# Patient Record
Sex: Male | Born: 1955 | Race: Black or African American | Hispanic: No | Marital: Married | Smoking: Former smoker
Health system: Southern US, Community
[De-identification: ages and names within clinical notes are randomized; demographics above are authoritative.]

## PROBLEM LIST (undated history)

## (undated) DIAGNOSIS — I2699 Other pulmonary embolism without acute cor pulmonale: Secondary | ICD-10-CM

## (undated) DIAGNOSIS — I82409 Acute embolism and thrombosis of unspecified deep veins of unspecified lower extremity: Secondary | ICD-10-CM

## (undated) DIAGNOSIS — C61 Malignant neoplasm of prostate: Secondary | ICD-10-CM

## (undated) DIAGNOSIS — M109 Gout, unspecified: Secondary | ICD-10-CM

## (undated) DIAGNOSIS — I10 Essential (primary) hypertension: Secondary | ICD-10-CM

## (undated) DIAGNOSIS — G4730 Sleep apnea, unspecified: Secondary | ICD-10-CM

## (undated) DIAGNOSIS — M1990 Unspecified osteoarthritis, unspecified site: Secondary | ICD-10-CM

## (undated) HISTORY — PX: WISDOM TOOTH EXTRACTION: SHX21

## (undated) HISTORY — PX: POLYPECTOMY: SHX149

## (undated) HISTORY — PX: PROSTATECTOMY: SHX69

## (undated) HISTORY — PX: APPENDECTOMY: SHX54

## (undated) HISTORY — PX: PROSTATE BIOPSY: SHX241

---

## 2006-06-28 ENCOUNTER — Emergency Department (HOSPITAL_COMMUNITY): Admission: AC | Admit: 2006-06-28 | Discharge: 2006-06-28 | Payer: Self-pay

## 2007-11-20 DIAGNOSIS — I2699 Other pulmonary embolism without acute cor pulmonale: Secondary | ICD-10-CM

## 2007-11-20 HISTORY — DX: Other pulmonary embolism without acute cor pulmonale: I26.99

## 2013-06-04 ENCOUNTER — Other Ambulatory Visit: Payer: Self-pay | Admitting: Urology

## 2013-06-04 DIAGNOSIS — C61 Malignant neoplasm of prostate: Secondary | ICD-10-CM

## 2013-08-11 ENCOUNTER — Ambulatory Visit (HOSPITAL_COMMUNITY)
Admission: RE | Admit: 2013-08-11 | Discharge: 2013-08-11 | Disposition: A | Discharge status: Home / Self Care | Payer: 59 | Source: Ambulatory Visit | Attending: Urology | Admitting: Urology

## 2013-08-11 DIAGNOSIS — C61 Malignant neoplasm of prostate: Secondary | ICD-10-CM | POA: Insufficient documentation

## 2013-08-11 LAB — CREATININE, SERUM
Creatinine, Ser: 1 mg/dL (ref 0.50–1.35)
GFR calc non Af Amer: 80 mL/min — ABNORMAL LOW (ref >90)

## 2013-08-11 MED ORDER — GADOBENATE DIMEGLUMINE 529 MG/ML IV SOLN: 20.0000 mL | Freq: Once | INTRAVENOUS | Status: AC | PRN

## 2013-09-07 ENCOUNTER — Other Ambulatory Visit: Payer: Self-pay | Admitting: Urology

## 2013-09-23 ENCOUNTER — Encounter (HOSPITAL_COMMUNITY): Payer: Self-pay | Admitting: Pharmacy Technician

## 2013-09-23 ENCOUNTER — Encounter (HOSPITAL_COMMUNITY): Payer: Self-pay

## 2013-09-23 ENCOUNTER — Ambulatory Visit (HOSPITAL_COMMUNITY)
Admission: RE | Admit: 2013-09-23 | Discharge: 2013-09-23 | Disposition: A | Discharge status: Home / Self Care | Payer: 59 | Source: Ambulatory Visit | Attending: Urology | Admitting: Urology

## 2013-09-23 ENCOUNTER — Encounter (HOSPITAL_COMMUNITY)
Admission: RE | Admit: 2013-09-23 | Discharge: 2013-09-23 | Disposition: A | Discharge status: Home / Self Care | Payer: 59 | Source: Ambulatory Visit | Attending: Urology | Admitting: Urology

## 2013-09-23 DIAGNOSIS — Z01810 Encounter for preprocedural cardiovascular examination: Secondary | ICD-10-CM | POA: Insufficient documentation

## 2013-09-23 DIAGNOSIS — Z01818 Encounter for other preprocedural examination: Secondary | ICD-10-CM | POA: Insufficient documentation

## 2013-09-23 DIAGNOSIS — Z01812 Encounter for preprocedural laboratory examination: Secondary | ICD-10-CM | POA: Insufficient documentation

## 2013-09-23 HISTORY — DX: Other pulmonary embolism without acute cor pulmonale: I26.99

## 2013-09-23 HISTORY — DX: Essential (primary) hypertension: I10

## 2013-09-23 HISTORY — DX: Gout, unspecified: M10.9

## 2013-09-23 HISTORY — DX: Sleep apnea, unspecified: G47.30

## 2013-09-23 LAB — BASIC METABOLIC PANEL
BUN: 15 mg/dL (ref 6–23)
CO2: 24 mEq/L (ref 19–32)
Creatinine, Ser: 0.94 mg/dL (ref 0.50–1.35)
GFR calc Af Amer: >90 mL/min (ref >90)
Glucose, Bld: 85 mg/dL (ref 70–99)
Sodium: 140 mEq/L (ref 135–145)

## 2013-09-23 LAB — CBC
MCH: 30.7 pg (ref 26.0–34.0)
MCHC: 34 g/dL (ref 30.0–36.0)
Platelets: 161 10*3/uL (ref 150–400)
RDW: 12.9 % (ref 11.5–15.5)

## 2013-09-23 NOTE — Patient Instructions (Addendum)
Dwayne Lawrence  09/23/2013   Your procedure is scheduled on: Wednesday Oct 07, 2013  Report to [facility] at 630  AM.  Call this number if you have problems the morning of surgery [phone]   Remember: follow any bowel prep orders from dr grapey   Do not eat food or drink liquids :After Midnight.     Take these medicines the morning of surgery with A SIP OF WATER: amlodipine                                SEE [facility] PREPARING FOR SURGERY SHEET             You may not have any metal on your body including hair pins and piercings  Do not wear jewelry, make-up.  Do not wear lotions, powders, or perfumes. You may wear deodorant.   Men may shave face and neck.  Do not bring valuables to the hospital. [facility] IS NOT RESPONSIBLE FOR VALUEABLES.  Contacts, dentures or bridgework may not be worn into surgery.  Leave suitcase in the car. After surgery it may be brought to your room.  For patients admitted to the hospital, checkout time is 11:00 AM the day of discharge.     Please read over the following fact sheets that you were given: blood fact sheet  Call Cain Sieve RN pre op nurse if needed [phone]    FAILURE TO FOLLOW THESE INSTRUCTIONS MAY RESULT IN THE CANCELLATION OF YOUR SURGERY.  PATIENT SIGNATURE___________________________________________  NURSE SIGNATURE_____________________________________________

## 2013-10-06 NOTE — H&P (Signed)
Reason For Visit   [name] presents today for a more extended cancer discussion. The prostate cancer history from Dr. Retta Diones is summarized below. The patient has undergone several consultations with regard to treatment options and was interested in robotic prostatectomy. He is here with his wife today to further discuss that option with me. There has been no other change clinically. He has had no intraabdominal surgery. He has essentially no voiding complaints with an AUA score of approximately 1-2. His preoperative sexual functioning score is 23/25.       History of Present Illness    Dwayne Lawrence comes in today with his wife for prostate cancer conference. His surveillance biopsy revealed Gleason 4+3 disease in 3 cores on the left side. Initially, an 2013 his initial biopsy in [place] revealed only one core showing Gleason 3+3 disease. Prostatic volume was 39 cc. He does not have significant lower urinary tract symptoms.      Adenocarcinoma of the prostate. This was originally diagnosed in 2013. His initial biopsies revealed a clinical stage TIcNxMx, Gleason 3+3 adenocarcinoma. 1/10 cores were positive for adenocarcinoma at the left base. 10% of that core was involved. Based on a PSA of 3.99 and his low volume, low grade disease, he is considered a low risk category. Despite this, he had a staging CT scan of his abdomen and pelvis which revealed no abnormal urologic findings. Left portal vein thrombosis was identified which resolved on followup CT scan.     Some recent MRI revealed some nodularity at the right base, opposite the side from his positive biopsy. He underwent surveillance biopsy 08/21/2013. This revealed 3 cores on the left now showing Gleason 4+3 disease. PSA earlier this year was approximately 9. MRI performed prior to his biopsy revealed that nodularity on the left, but no evidence of extracapsular extension, seminal vesicle involvement, bony disease or lymph node  metastases.    Based on the Kattan nomogram, he has an approximate 65% chance of having organ confined disease, 15% chance of seminal vesicle involvement and approximately 3% chance of having nodal disease.   Past Medical History Problems  1. History of Gout (274.9) 2. History of sleep apnea (V13.89) 3. Personal history of prostate cancer (V10.46) 4. History of Pulmonary Embolism (V12.55)  Surgical History Problems  1. History of No Surgical Problems  Current Meds 1. AmLODIPine Besylate 5 MG Oral Tablet;  Therapy: (Recorded:18Sep2013) to Recorded  Allergies Medication  1. No Known Drug Allergies  Family History Problems  1. Family history of Colon Cancer (V16.0) : Mother 2. Family history of Colon Cancer (V16.0) : Father 3. Family history of Death In The Family Father   Deceased at age 39; colon cancer 4. Family history of Family Health Status Number Of Children   2 sons & 1 daughter  Social History Problems  1. Alcohol Use   3 2. Caffeine Use   1 per week 3. 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 Marital History - Currently Married 5. Occupation: Retired  Research scientist (life sciences) Vital Signs [Data Includes: Last 1 Day]  Recorded: 27Oct2014 04:25PM  Blood Pressure: 112 / 75 Temperature: 98.2 F Heart Rate: 81  WD WN Male in NAD Resp: nl Cardiac: nl Abd: soft/ NT/ no HSM Ext: no edma/ tenderess Assessment Assessed  1. Prostate cancer (185)             End of Encounter Meds  Medication Name Instruction AmLODIPine Besylate 5 MG Oral Tablet   Plan  Prostate cancer  1. Follow-up Keep Future Appt Office  Follow-up  Status: Hold For - Appointment  Requested  for: 27Oct2014 2. PT/OT Referral Referral  Referral  Status: Hold For - Appointment,PreCert,Date of  Service,Physical Therapy  Requested for: 27Oct2014  Discussion/Summary  A total of 50 minutes were spent in the overall care of the patient today with 50 minutes in direct face to  face consultation.    The patient was counseled about the natural history of prostate cancer and the standard treatment options that are available for prostate cancer. It was explained to him how his age and life expectancy, clinical stage, Gleason score, and PSA affect his prognosis, the decision to proceed with additional staging studies, as well as how that information influences recommended treatment strategies. We discussed the roles for active surveillance, radiation therapy, surgical therapy, androgen deprivation, as well as ablative therapy options for the treatment of prostate cancer as appropriate to his individual cancer situation. We discussed the risks and benefits of these options with regard to their impact on cancer control and also in terms of potential adverse events, complications, and impact on quiality of life particularly related to urinary, bowel, and sexual function. The patient was encouraged to ask questions throughout the discussion today and all questions were answered to his stated satisfaction. In addition, the patient was provided with and/or directed to appropriate resources and literature for further education about prostate cancer and treatment options.   We discussed surgical therapy for prostate cancer including the different available surgical approaches. We discussed, in detail, the risks and expectations of surgery with regard to cancer control, urinary control, and erectile function as well as the expected postoperative recovery process. The risks, potential complications/adverse events of radical prostatectomy as well as alternative options were explained to the patient.   We discussed surgical therapy for prostate cancer including the different available surgical approaches. We discussed, in detail, the risks and expectations of surgery with regard to cancer control, urinary control, and erectile function as well as the expected postoperative recovery process.  Additional risks of surgery including but not limited to bleeding, infection, hernia formation, nerve damage, lymphocele formation, bowel/rectal injury potentially necessitating colostomy, damage to the urinary tract resulting in urine leakage, urethral stricture, and the cardiopulmonary risks such as myocardial infarction, stroke, death, venothromboembolism, etc. were explained. The risk of open surgical conversion for robotic/laparoscopic prostatectomy was also discussed.    AmendmentB PLND with limited left nerve spare   Future Appointments  Date/Time Provider Specialty Site 10/07/2013 08:30 AM Barron Alvine, M.D. Urology [facility] 10/07/2013 08:30 AM Pecola Leisure, PA-C Urology [facility] 10/14/2013 09:15 AM Denna Haggard, NP-C Urology [facility] Electronically signed by : Barron Alvine, M.D.; Sep 15 2013  8:26AM EST

## 2013-10-07 ENCOUNTER — Ambulatory Visit (HOSPITAL_COMMUNITY)
Admission: RE | Admit: 2013-10-07 | Discharge: 2013-10-08 | Disposition: A | Discharge status: Home / Self Care | Payer: 59 | Source: Ambulatory Visit | Attending: Urology | Admitting: Urology

## 2013-10-07 ENCOUNTER — Encounter (HOSPITAL_COMMUNITY): Payer: 59 | Admitting: Certified Registered Nurse Anesthetist

## 2013-10-07 ENCOUNTER — Ambulatory Visit (HOSPITAL_COMMUNITY): Payer: 59 | Admitting: Certified Registered Nurse Anesthetist

## 2013-10-07 ENCOUNTER — Encounter (HOSPITAL_COMMUNITY): Payer: Self-pay | Admitting: *Deleted

## 2013-10-07 ENCOUNTER — Encounter (HOSPITAL_COMMUNITY)
Admission: RE | Disposition: A | Discharge status: Home / Self Care | Payer: Self-pay | Source: Ambulatory Visit | Attending: Urology

## 2013-10-07 DIAGNOSIS — G4730 Sleep apnea, unspecified: Secondary | ICD-10-CM | POA: Insufficient documentation

## 2013-10-07 DIAGNOSIS — M109 Gout, unspecified: Secondary | ICD-10-CM | POA: Insufficient documentation

## 2013-10-07 DIAGNOSIS — Z86711 Personal history of pulmonary embolism: Secondary | ICD-10-CM | POA: Insufficient documentation

## 2013-10-07 DIAGNOSIS — C61 Malignant neoplasm of prostate: Principal | ICD-10-CM | POA: Insufficient documentation

## 2013-10-07 HISTORY — PX: ROBOT ASSISTED LAPAROSCOPIC RADICAL PROSTATECTOMY: SHX5141

## 2013-10-07 HISTORY — PX: LYMPHADENECTOMY: SHX5960

## 2013-10-07 LAB — HEMOGLOBIN AND HEMATOCRIT, BLOOD
HCT: 44.1 % (ref 39.0–52.0)
Hemoglobin: 14.7 g/dL (ref 13.0–17.0)

## 2013-10-07 LAB — TYPE AND SCREEN
ABO/RH(D): B POS
Antibody Screen: NEGATIVE

## 2013-10-07 SURGERY — ROBOTIC ASSISTED LAPAROSCOPIC RADICAL PROSTATECTOMY
Anesthesia: General | Site: Abdomen | Wound class: Clean Contaminated

## 2013-10-07 MED ORDER — ONDANSETRON HCL 4 MG/2ML IJ SOLN
INTRAMUSCULAR | Status: DC | PRN
  Administered 2013-10-07: 4 mg via INTRAVENOUS

## 2013-10-07 MED ORDER — LACTATED RINGERS IV SOLN: INTRAVENOUS | Status: DC

## 2013-10-07 MED ORDER — DEXAMETHASONE SODIUM PHOSPHATE 10 MG/ML IJ SOLN
INTRAMUSCULAR | Status: DC | PRN
  Administered 2013-10-07: 10 mg via INTRAVENOUS

## 2013-10-07 MED ORDER — ROCURONIUM BROMIDE 100 MG/10ML IV SOLN
INTRAVENOUS | Status: AC
  Filled 2013-10-07: qty 1

## 2013-10-07 MED ORDER — DEXTROSE-NACL 5-0.45 % IV SOLN
INTRAVENOUS | Status: DC
  Administered 2013-10-07 – 2013-10-08 (×3): via INTRAVENOUS

## 2013-10-07 MED ORDER — LIDOCAINE HCL (CARDIAC) 20 MG/ML IV SOLN
INTRAVENOUS | Status: AC
  Filled 2013-10-07: qty 5

## 2013-10-07 MED ORDER — SUCCINYLCHOLINE CHLORIDE 20 MG/ML IJ SOLN
INTRAMUSCULAR | Status: DC | PRN
  Administered 2013-10-07: 140 mg via INTRAVENOUS

## 2013-10-07 MED ORDER — FENTANYL CITRATE 0.05 MG/ML IJ SOLN
INTRAMUSCULAR | Status: AC
  Filled 2013-10-07: qty 5

## 2013-10-07 MED ORDER — BUPIVACAINE-EPINEPHRINE 0.25% -1:200000 IJ SOLN
INTRAMUSCULAR | Status: DC | PRN
  Administered 2013-10-07: 30 mL

## 2013-10-07 MED ORDER — HYDROCODONE-ACETAMINOPHEN 5-325 MG PO TABS
1.0000 | ORAL_TABLET | ORAL | Status: DC | PRN
  Administered 2013-10-08: 2 via ORAL
  Filled 2013-10-07 (×2): qty 1

## 2013-10-07 MED ORDER — PROPOFOL 10 MG/ML IV BOLUS
INTRAVENOUS | Status: AC
  Filled 2013-10-07: qty 20

## 2013-10-07 MED ORDER — MIDAZOLAM HCL 5 MG/5ML IJ SOLN
INTRAMUSCULAR | Status: DC | PRN
  Administered 2013-10-07: 2 mg via INTRAVENOUS

## 2013-10-07 MED ORDER — GLYCOPYRROLATE 0.2 MG/ML IJ SOLN
INTRAMUSCULAR | Status: AC
  Filled 2013-10-07: qty 4

## 2013-10-07 MED ORDER — PHENOL 1.4 % MT LIQD
1.0000 | OROMUCOSAL | Status: DC | PRN
  Administered 2013-10-07: 1 via OROMUCOSAL
  Filled 2013-10-07 (×2): qty 177

## 2013-10-07 MED ORDER — ACETAMINOPHEN 325 MG PO TABS: 650.0000 mg | ORAL_TABLET | ORAL | Status: DC | PRN

## 2013-10-07 MED ORDER — CIPROFLOXACIN HCL 500 MG PO TABS: 500.0000 mg | ORAL_TABLET | Freq: Two times a day (BID) | ORAL | Status: DC

## 2013-10-07 MED ORDER — SODIUM CHLORIDE 0.9 % IR SOLN
Status: DC | PRN
  Administered 2013-10-07: 1000 mL via INTRAVESICAL

## 2013-10-07 MED ORDER — METHYLENE BLUE 1 % INJ SOLN
INTRAMUSCULAR | Status: AC
  Filled 2013-10-07: qty 10

## 2013-10-07 MED ORDER — HEPARIN SODIUM (PORCINE) 1000 UNIT/ML IJ SOLN
INTRAMUSCULAR | Status: AC
  Filled 2013-10-07: qty 1

## 2013-10-07 MED ORDER — HYDROMORPHONE HCL PF 1 MG/ML IJ SOLN
INTRAMUSCULAR | Status: AC
  Filled 2013-10-07: qty 1

## 2013-10-07 MED ORDER — ROCURONIUM BROMIDE 100 MG/10ML IV SOLN
INTRAVENOUS | Status: DC | PRN
  Administered 2013-10-07: 50 mg via INTRAVENOUS
  Administered 2013-10-07: 10 mg via INTRAVENOUS
  Administered 2013-10-07 (×3): 5 mg via INTRAVENOUS
  Administered 2013-10-07: 10 mg via INTRAVENOUS

## 2013-10-07 MED ORDER — MIDAZOLAM HCL 2 MG/2ML IJ SOLN
INTRAMUSCULAR | Status: AC
  Filled 2013-10-07: qty 2

## 2013-10-07 MED ORDER — LIDOCAINE HCL (CARDIAC) 20 MG/ML IV SOLN
INTRAVENOUS | Status: DC | PRN
  Administered 2013-10-07: 100 mg via INTRAVENOUS

## 2013-10-07 MED ORDER — GLYCOPYRROLATE 0.2 MG/ML IJ SOLN
INTRAMUSCULAR | Status: DC | PRN
  Administered 2013-10-07: 0.2 mg via INTRAVENOUS
  Administered 2013-10-07: .8 mg via INTRAVENOUS

## 2013-10-07 MED ORDER — FENTANYL CITRATE 0.05 MG/ML IJ SOLN
INTRAMUSCULAR | Status: DC | PRN
  Administered 2013-10-07 (×5): 50 ug via INTRAVENOUS
  Administered 2013-10-07: 100 ug via INTRAVENOUS
  Administered 2013-10-07 (×3): 50 ug via INTRAVENOUS

## 2013-10-07 MED ORDER — STERILE WATER FOR IRRIGATION IR SOLN
Status: DC | PRN
  Administered 2013-10-07: 3000 mL via INTRAVESICAL

## 2013-10-07 MED ORDER — AMLODIPINE BESYLATE 5 MG PO TABS
5.0000 mg | ORAL_TABLET | Freq: Every morning | ORAL | Status: DC
  Administered 2013-10-08: 5 mg via ORAL
  Filled 2013-10-07: qty 1

## 2013-10-07 MED ORDER — MORPHINE SULFATE 2 MG/ML IJ SOLN: 2.0000 mg | INTRAMUSCULAR | Status: DC | PRN

## 2013-10-07 MED ORDER — MENTHOL 3 MG MT LOZG
1.0000 | LOZENGE | OROMUCOSAL | Status: DC | PRN
  Administered 2013-10-07: 21:00:00 3 mg via ORAL
  Filled 2013-10-07 (×2): qty 9

## 2013-10-07 MED ORDER — SUCCINYLCHOLINE CHLORIDE 20 MG/ML IJ SOLN
INTRAMUSCULAR | Status: AC
  Filled 2013-10-07: qty 2

## 2013-10-07 MED ORDER — ONDANSETRON HCL 4 MG/2ML IJ SOLN
INTRAMUSCULAR | Status: AC
  Filled 2013-10-07: qty 2

## 2013-10-07 MED ORDER — CEFAZOLIN SODIUM-DEXTROSE 2-3 GM-% IV SOLR
INTRAVENOUS | Status: AC
  Filled 2013-10-07: qty 50

## 2013-10-07 MED ORDER — GLYCOPYRROLATE 0.2 MG/ML IJ SOLN
INTRAMUSCULAR | Status: AC
  Filled 2013-10-07: qty 1

## 2013-10-07 MED ORDER — PROPOFOL 10 MG/ML IV BOLUS
INTRAVENOUS | Status: DC | PRN
  Administered 2013-10-07: 200 mg via INTRAVENOUS

## 2013-10-07 MED ORDER — PROMETHAZINE HCL 25 MG/ML IJ SOLN: 6.2500 mg | INTRAMUSCULAR | Status: DC | PRN

## 2013-10-07 MED ORDER — CEFAZOLIN SODIUM-DEXTROSE 2-3 GM-% IV SOLR
2.0000 g | INTRAVENOUS | Status: AC
  Administered 2013-10-07: 2 g via INTRAVENOUS

## 2013-10-07 MED ORDER — LACTATED RINGERS IV SOLN
INTRAVENOUS | Status: DC | PRN
  Administered 2013-10-07 (×2): via INTRAVENOUS

## 2013-10-07 MED ORDER — SODIUM CHLORIDE 0.9 % IV BOLUS (SEPSIS)
1000.0000 mL | Freq: Once | INTRAVENOUS | Status: AC
  Administered 2013-10-07: 1000 mL via INTRAVENOUS

## 2013-10-07 MED ORDER — KETOROLAC TROMETHAMINE 30 MG/ML IJ SOLN
30.0000 mg | Freq: Four times a day (QID) | INTRAMUSCULAR | Status: AC
  Administered 2013-10-07 – 2013-10-08 (×6): 30 mg via INTRAVENOUS
  Filled 2013-10-07 (×5): qty 1

## 2013-10-07 MED ORDER — NEOSTIGMINE METHYLSULFATE 1 MG/ML IJ SOLN
INTRAMUSCULAR | Status: AC
  Filled 2013-10-07: qty 10

## 2013-10-07 MED ORDER — HYDROCODONE-ACETAMINOPHEN 5-325 MG PO TABS: 1.0000 | ORAL_TABLET | Freq: Four times a day (QID) | ORAL | Status: DC | PRN

## 2013-10-07 MED ORDER — NEOSTIGMINE METHYLSULFATE 1 MG/ML IJ SOLN
INTRAMUSCULAR | Status: DC | PRN
  Administered 2013-10-07: 5 mg via INTRAVENOUS

## 2013-10-07 MED ORDER — DEXAMETHASONE SODIUM PHOSPHATE 10 MG/ML IJ SOLN
INTRAMUSCULAR | Status: AC
  Filled 2013-10-07: qty 1

## 2013-10-07 MED ORDER — KETOROLAC TROMETHAMINE 30 MG/ML IJ SOLN
INTRAMUSCULAR | Status: AC
  Administered 2013-10-07: 30 mg via INTRAVENOUS
  Filled 2013-10-07: qty 1

## 2013-10-07 MED ORDER — LACTATED RINGERS IV SOLN
INTRAVENOUS | Status: DC | PRN
  Administered 2013-10-07: 10:00:00

## 2013-10-07 MED ORDER — MEPERIDINE HCL 50 MG/ML IJ SOLN: 6.2500 mg | INTRAMUSCULAR | Status: DC | PRN

## 2013-10-07 MED ORDER — HYDROMORPHONE HCL PF 1 MG/ML IJ SOLN
0.2500 mg | INTRAMUSCULAR | Status: DC | PRN
  Administered 2013-10-07 (×2): 0.5 mg via INTRAVENOUS

## 2013-10-07 SURGICAL SUPPLY — 48 items
APPLICATOR SURGIFLO ENDO (HEMOSTASIS) ×3 IMPLANT
CANISTER SUCTION 2500CC (MISCELLANEOUS) IMPLANT
CATH FOLEY 2WAY SLVR 18FR 30CC (CATHETERS) ×3 IMPLANT
CATH ROBINSON RED A/P 16FR (CATHETERS) ×3 IMPLANT
CATH ROBINSON RED A/P 8FR (CATHETERS) ×3 IMPLANT
CATH TIEMANN FOLEY 18FR 5CC (CATHETERS) ×3 IMPLANT
CHLORAPREP W/TINT 26ML (MISCELLANEOUS) ×3 IMPLANT
CLIP LIGATING HEM O LOK PURPLE (MISCELLANEOUS) IMPLANT
CORD HIGH FREQUENCY UNIPOLAR (ELECTROSURGICAL) ×3 IMPLANT
COVER SURGICAL LIGHT HANDLE (MISCELLANEOUS) ×3 IMPLANT
COVER TIP SHEARS 8 DVNC (MISCELLANEOUS) ×2 IMPLANT
COVER TIP SHEARS 8MM DA VINCI (MISCELLANEOUS) ×1
CUTTER ECHEON FLEX ENDO 45 340 (ENDOMECHANICALS) ×3 IMPLANT
DECANTER SPIKE VIAL GLASS SM (MISCELLANEOUS) ×3 IMPLANT
DRAPE SURG IRRIG POUCH 19X23 (DRAPES) ×3 IMPLANT
DRSG TEGADERM 2-3/8X2-3/4 SM (GAUZE/BANDAGES/DRESSINGS) ×12 IMPLANT
DRSG TEGADERM 4X4.75 (GAUZE/BANDAGES/DRESSINGS) ×6 IMPLANT
DRSG TEGADERM 6X8 (GAUZE/BANDAGES/DRESSINGS) ×9 IMPLANT
ELECT REM PT RETURN 9FT ADLT (ELECTROSURGICAL) ×3
ELECTRODE REM PT RTRN 9FT ADLT (ELECTROSURGICAL) ×2 IMPLANT
GAUZE SPONGE 2X2 8PLY STRL LF (GAUZE/BANDAGES/DRESSINGS) ×2 IMPLANT
GLOVE BIO SURGEON STRL SZ 6.5 (GLOVE) ×3 IMPLANT
GLOVE BIOGEL M STRL SZ7.5 (GLOVE) ×6 IMPLANT
GOWN PREVENTION PLUS LG XLONG (DISPOSABLE) IMPLANT
GOWN STRL REIN XL XLG (GOWN DISPOSABLE) ×9 IMPLANT
HOLDER FOLEY CATH W/STRAP (MISCELLANEOUS) ×3 IMPLANT
IV LACTATED RINGERS 1000ML (IV SOLUTION) IMPLANT
KIT ACCESSORY DA VINCI DISP (KITS) ×1
KIT ACCESSORY DVNC DISP (KITS) ×2 IMPLANT
LABEL STERILE EO BLANK 1X3 WHT (LABEL) ×3 IMPLANT
NDL SAFETY ECLIPSE 18X1.5 (NEEDLE) ×2 IMPLANT
NEEDLE HYPO 18GX1.5 SHARP (NEEDLE) ×1
PACK ROBOT UROLOGY CUSTOM (CUSTOM PROCEDURE TRAY) ×3 IMPLANT
RELOAD GREEN ECHELON 45 (STAPLE) ×3 IMPLANT
SEALER TISSUE G2 CVD JAW 45CM (ENDOMECHANICALS) ×3 IMPLANT
SEALER TISSUE G2 STRG ARTC 35C (ENDOMECHANICALS) IMPLANT
SET TUBE IRRIG SUCTION NO TIP (IRRIGATION / IRRIGATOR) ×3 IMPLANT
SOLUTION ELECTROLUBE (MISCELLANEOUS) ×3 IMPLANT
SPONGE GAUZE 2X2 STER 10/PKG (GAUZE/BANDAGES/DRESSINGS) ×1
SURGIFLO W/THROMBIN 8M KIT (HEMOSTASIS) ×3 IMPLANT
SUT ETHILON 3 0 PS 1 (SUTURE) ×3 IMPLANT
SUT VIC AB 2-0 SH 27 (SUTURE)
SUT VIC AB 2-0 SH 27X BRD (SUTURE) IMPLANT
SUT VICRYL 0 UR6 27IN ABS (SUTURE) ×3 IMPLANT
SYR 27GX1/2 1ML LL SAFETY (SYRINGE) ×3 IMPLANT
TOWEL OR NON WOVEN STRL DISP B (DISPOSABLE) ×3 IMPLANT
TROCAR BLADELESS OPT 5 100 (ENDOMECHANICALS) IMPLANT
WATER STERILE IRR 1500ML POUR (IV SOLUTION) IMPLANT

## 2013-10-07 NOTE — Transfer of Care (Signed)
Immediate Anesthesia Transfer of Care Note  Patient: Dwayne Lawrence  Procedure(s) Performed: Procedure(s) (LRB): ROBOTIC ASSISTED LAPAROSCOPIC RADICAL PROSTATECTOMY (N/A) LYMPHADENECTOMY " BILATERAL PELVIC LYMPH NODE DISSECTION" (Bilateral)  Patient Location: PACU  Anesthesia Type: General  Level of Consciousness: sedated, patient cooperative and responds to stimulation  Airway & Oxygen Therapy: Patient Spontanous Breathing and Patient connected to face mask oxgen  Post-op Assessment: Report given to PACU RN and Post -op Vital signs reviewed and stable  Post vital signs: Reviewed and stable  Complications: No apparent anesthesia complications

## 2013-10-07 NOTE — Interval H&P Note (Signed)
History and Physical Interval Note:  10/07/2013 8:19 AM  Dwayne Lawrence  has presented today for surgery, with the diagnosis of PROSTATE CANCER  The various methods of treatment have been discussed with the patient and family. After consideration of risks, benefits and other options for treatment, the patient has consented to  Procedure(s): ROBOTIC ASSISTED LAPAROSCOPIC RADICAL PROSTATECTOMY (N/A) LYMPHADENECTOMY " BILATERAL PELVIC LYMPH NODE DISSECTION" (Bilateral) as a surgical intervention .  The patient's history has been reviewed, patient examined, no change in status, stable for surgery.  I have reviewed the patient's chart and labs.  Questions were answered to the patient's satisfaction.     Eldra Word S

## 2013-10-07 NOTE — Anesthesia Preprocedure Evaluation (Addendum)
Anesthesia Evaluation  Patient identified by MRN, date of birth, ID band Patient awake    Reviewed: Allergy & Precautions, H&P , NPO status , Patient's Chart, lab work & pertinent test results  Airway Mallampati: II TM Distance: >3 FB Neck ROM: Full    Dental no notable dental hx.    Pulmonary sleep apnea , former smoker, PE breath sounds clear to auscultation  Pulmonary exam normal       Cardiovascular hypertension, Pt. on medications Rhythm:Regular Rate:Normal     Neuro/Psych negative neurological ROS  negative psych ROS   GI/Hepatic negative GI ROS, Neg liver ROS,   Endo/Other  negative endocrine ROS  Renal/GU negative Renal ROS  negative genitourinary   Musculoskeletal negative musculoskeletal ROS (+)   Abdominal   Peds negative pediatric ROS (+)  Hematology negative hematology ROS (+)   Anesthesia Other Findings   Reproductive/Obstetrics negative OB ROS                          Anesthesia Physical Anesthesia Plan  ASA: II  Anesthesia Plan: General   Post-op Pain Management:    Induction: Intravenous  Airway Management Planned: Oral ETT  Additional Equipment:   Intra-op Plan:   Post-operative Plan: Extubation in OR  Informed Consent: I have reviewed the patients History and Physical, chart, labs and discussed the procedure including the risks, benefits and alternatives for the proposed anesthesia with the patient or authorized representative who has indicated his/her understanding and acceptance.   Dental advisory given  Plan Discussed with: CRNA  Anesthesia Plan Comments:         Anesthesia Quick Evaluation

## 2013-10-07 NOTE — Anesthesia Postprocedure Evaluation (Signed)
  Anesthesia Post-op Note  Patient: Dwayne Lawrence  Procedure(s) Performed: Procedure(s) (LRB): ROBOTIC ASSISTED LAPAROSCOPIC RADICAL PROSTATECTOMY (N/A) LYMPHADENECTOMY " BILATERAL PELVIC LYMPH NODE DISSECTION" (Bilateral)  Patient Location: PACU  Anesthesia Type: General  Level of Consciousness: awake and alert   Airway and Oxygen Therapy: Patient Spontanous Breathing  Post-op Pain: mild  Post-op Assessment: Post-op Vital signs reviewed, Patient's Cardiovascular Status Stable, Respiratory Function Stable, Patent Airway and No signs of Nausea or vomiting  Last Vitals:  Filed Vitals:   10/07/13 1339  BP: 143/90  Pulse: 70  Temp: 36.8 C  Resp: 16    Post-op Vital Signs: stable   Complications: No apparent anesthesia complications

## 2013-10-07 NOTE — Progress Notes (Signed)
Patient ID: Paxson Harrower, male   DOB: 15-May-1956, 57 y.o.   MRN: [id number] Post-op note  Subjective: The patient is doing well.  No complaints.  Has not amb yet.  Is using IS.  Denies N/V  Objective: Vital signs in last 24 hours: Temp:  [98.2 F (36.8 C)-98.4 F (36.9 C)] 98.2 F (36.8 C) (11/19 1339) Pulse Rate:  [67-72] 70 (11/19 1339) Resp:  [8-18] 16 (11/19 1339) BP: (134-176)/(84-106) 143/90 mmHg (11/19 1339) SpO2:  [94 %-99 %] 97 % (11/19 1339) Weight:  [109.589 kg (241 lb 9.6 oz)] 109.589 kg (241 lb 9.6 oz) (11/19 1339)  Intake/Output from previous day:   Intake/Output this shift: Total I/O In: 5390 [I.V.:3900; Other:490; IV Piggyback:1000] Out: 200 [Blood:200]  Physical Exam:  General: Alert and oriented. Abdomen: Soft, Nondistended. Incisions: Clean and dry.  Lab Results:  Recent Labs  10/07/13 1235  HGB 14.7  HCT 44.1    Assessment/Plan: POD#0   1) Continue to monitor 2) DVT prophy, clears, IS, amb, pain control    LOS: 0 days   YARBROUGH,Rashaun Wichert G. 10/07/2013, 4:07 PM

## 2013-10-07 NOTE — Op Note (Signed)
Preoperative diagnosis: Clinical stage T1c Adenocarcinoma prostate  Postoperative diagnosis: Same  Procedure: Robotic-assisted laparoscopic radical retropubic prostatectomy with bilateral pelvic lymph node dissection  Surgeon: Valetta Fuller, MD  Asst.: Pecola Leisure, PA Anesthesia: Gen. Endotracheal  Indications: Patient was diagnosed with clinical stage TIc Adenocarcinoma the prostate. He underwent extensive consultation with regard to treatment options. The patient decided on a surgical approach. He appeared to understand the distinct advantages as well as the disadvantages of this procedure. The patient has performed a mechanical bowel prep. He has had placement of PAS compression boots and has received perioperative antibiotics. The patient's preoperative PSA was 4.0. Ultrasound revealed a 39 g prostate.   Technique and findings:The patient was brought to the operating room and had successful induction of general endotracheal anesthesia.the patient was placed in a low lithotomy position with careful padding of all extremities. He was secured to the operative table and placed in the steep Trendelenburg position. He was prepped and draped in usual manner. A Foley catheter was placed sterilely on the field. Camera port site was chosen 18 cm above the pubic symphysis just to the left of the umbilicus. A standard open Hassan technique was utilized. A 12 mm trocar was placed without difficulty. The camera was then inserted and no abnormalities were noted within the pelvis. The trochars were placed with direct visual guidance. This included 3 8mm robotic trochars and a 12 mm and 5 mm assist ports. Once all the ports were placed the robot was docked. The bladder was filled and the space of Retzius was developed with electrocautery dissection as well as blunt dissection. Superficial fat off the endopelvic fascia and bladder neck was removed with electrocautery scissors. The endopelvic fascia was then incised  bilaterally from base to apex. Levator musculature was swept off the apex of the prostate isolating the dorsal venous complex which was then stapled with the ETS stapling device. The anterior bladder neck was identified with the aid of the Foley balloon. This was then transected down to the Foley catheter with electrocautery scissors. The Foley catheter was then retracted anteriorly. Indigo carmine was given and we appeared to be well away from the ureteral orifices. The posterior bladder neck was then transected and the dissection carried down to the adnexal structures. The seminal vesicles and vas deferens on both sides were then individually dissected free and retracted anteriorly. The posterior plane between the rectum and prostate was then established primarily with blunt dissection.  Attention was then turned towards nerve sparing. The patient was felt to be a candidate for right-sided nerve sparing and limited left-sided nerve sparing. Superficial fascia along the anterior lateral aspect of the prostate was incised bilaterally. This tissue was then swept laterally until we were able to establish a groove between the neurovascular tissue and the posterior lateral aspect on the prostate bilaterally. This groove was then extended from the apex back to the base of the prostate. With the prostate retracted anteriorly the vascular pedicles of the prostate were taken with the Enseal device. The Foley catheter was then reinserted and the anterior urethra was transected. The posterior urethra was then transected as were some rectourethralis fibers. The prostate was then removed from the pelvis. The pelvis was then copiously irrigated. Rectal insufflation was performed and there was no evidence of rectal injury.  Attention was then turned towards bilateral pelvic lymph node dissection. The obturator node packets were removed I laterally and the dissection extended towards the bifurcation of the iliac artery. The  obturator nerve was identified on both sides and preserved. Hemalock clips were used for small veins and lymphatic channels. The node packets were sent for permanent analysis.  Attention was then turned towards reconstruction. The bladder neck did not require any reconstruction. The bladder neck and posterior urethra were reapproximated at the 6:00 position utilizing a 2-0 Vicryl suture. The rest of the anastomosis was done with a double-armed 2-0 V-lock suture in a 360 degree manner. Additional indigo carmine was given. A new catheter was placed and bladder irrigation revealed no evidence of leakage. A Blake drain was placed through one of the robotic trochars and positioned in the retropubic space above the anastomosis. This was then secured to the skin with a nylon suture. The prostate was placed in the Endopouch retrieval bag. The 12 mm trocar site was closed with a Vicryl suture with the aid of a suture passer. Our other trochars were taken out with direct visual guidance without evidence of any bleeding. The camera port incision was extended slightly to allow for removal of the specimen and then closed with a running Vicryl suture. All port sites were infiltrated with Marcaine and then closed with surgical clips. The patient was then taken to recovery room having had no obvious complications or problems. Sponge and needle counts were correct.

## 2013-10-07 NOTE — Preoperative (Addendum)
Beta Blockers   Reason not to administer Beta Blockers:Not Applicable 

## 2013-10-08 ENCOUNTER — Encounter (HOSPITAL_COMMUNITY): Payer: Self-pay | Admitting: Urology

## 2013-10-08 LAB — HEMOGLOBIN AND HEMATOCRIT, BLOOD: HCT: 37.8 % — ABNORMAL LOW (ref 39.0–52.0)

## 2013-10-08 LAB — BASIC METABOLIC PANEL
BUN: 7 mg/dL (ref 6–23)
CO2: 22 mEq/L (ref 19–32)
Calcium: 8.3 mg/dL — ABNORMAL LOW (ref 8.4–10.5)
Chloride: 104 mEq/L (ref 96–112)
GFR calc non Af Amer: >90 mL/min (ref >90)
Glucose, Bld: 146 mg/dL — ABNORMAL HIGH (ref 70–99)
Potassium: 3.9 mEq/L (ref 3.5–5.1)
Sodium: 136 mEq/L (ref 135–145)

## 2013-10-08 MED ORDER — BISACODYL 10 MG RE SUPP
10.0000 mg | Freq: Once | RECTAL | Status: AC
  Administered 2013-10-08: 10 mg via RECTAL
  Filled 2013-10-08: qty 1

## 2013-10-08 NOTE — Progress Notes (Signed)
1 Day Post-Op Subjective: Patient reports pain control good. No complaints this morning or concerns. He has ambulated.  Objective: Vital signs in last 24 hours: Temp:  [98.2 F (36.8 C)-98.6 F (37 C)] 98.2 F (36.8 C) (11/20 0550) Pulse Rate:  [56-72] 56 (11/20 0550) Resp:  [8-16] 16 (11/20 0550) BP: (127-176)/(78-106) 135/85 mmHg (11/20 0550) SpO2:  [94 %-100 %] 98 % (11/20 0550) Weight:  [109.589 kg (241 lb 9.6 oz)] 109.589 kg (241 lb 9.6 oz) (11/19 1339)  Intake/Output from previous day: 11/19 0701 - 11/20 0700 In: 7865 [P.O.:600; I.V.:5775; IV Piggyback:1000] Out: 1920 [Urine:1550; Drains:170; Blood:200] Intake/Output this shift:    Physical Exam:  General:alert and cooperative Lungs: Normal effort GI: not done and soft Incisions: Okay Urine: Clear Extremities: No edema  Lab Results:  Recent Labs  10/07/13 1235 10/08/13 0500  HGB 14.7 12.7*  HCT 44.1 37.8*   BMET  Recent Labs  10/08/13 0500  NA 136  K 3.9  CL 104  CO2 22  GLUCOSE 146*  BUN 7  CREATININE 0.93  CALCIUM 8.3*   No results found for this basename: LABPT, INR,  in the last 72 hours No results found for this basename: LABURIN,  in the last 72 hours No results found for this or any previous visit.  Studies/Results: No results found.  Assessment/Plan: 1 Day Post-Op, Procedure(s) (LRB): ROBOTIC ASSISTED LAPAROSCOPIC RADICAL PROSTATECTOMY (N/A) LYMPHADENECTOMY " BILATERAL PELVIC LYMPH NODE DISSECTION" (Bilateral)  Ambulate, Incentive spirometry Check drain creatinine level Probable discharge later today.   LOS: 1 day   Macklin Jacquin S 10/08/2013, 7:55 AM

## 2013-10-08 NOTE — Progress Notes (Signed)
Pt d/c to home with spouse, pt is alert and oriented. D/c instructions reviewed with patient. Supplies for dressing change and foley given to pt. Pt and family has no questions

## 2013-10-08 NOTE — Discharge Summary (Signed)
  Date of admission: 10/07/2013  Date of discharge: 10/08/2013  Admission diagnosis: Prostate Cancer  Discharge diagnosis: Prostate Cancer  History and Physical: For full details, please see admission history and physical. Briefly, Dwayne Lawrence is a 57 y.o. gentleman with localized prostate cancer.  After discussing management/treatment options, he elected to proceed with surgical treatment.  Hospital Course: Dwayne Lawrence was taken to the operating room on 10/07/2013 and underwent a robotic assisted laparoscopic radical prostatectomy. He tolerated this procedure well and without complications. Postoperatively, he was able to be transferred to a regular hospital room following recovery from anesthesia.  He was able to begin ambulating the night of surgery. He remained hemodynamically stable overnight.  He had excellent urine output with appropriately minimal output from his pelvic drain and his pelvic drain was removed on POD #1.  He was transitioned to oral pain medication, tolerated a clear liquid diet, and had met all discharge criteria and was able to be discharged home later on POD#1.  Laboratory values:  Recent Labs  10/07/13 1235 10/08/13 0500  HGB 14.7 12.7*  HCT 44.1 37.8*    Disposition: Home  Discharge instruction: He was instructed to be ambulatory but to refrain from heavy lifting, strenuous activity, or driving. He was instructed on urethral catheter care.  Discharge medications:     Medication List         amLODipine 5 MG tablet  Commonly known as:  NORVASC  Take 5 mg by mouth every morning.     ciprofloxacin 500 MG tablet  Commonly known as:  CIPRO  Take 1 tablet (500 mg total) by mouth 2 (two) times daily. Start day prior to office visit for foley removal     HYDROcodone-acetaminophen 5-325 MG per tablet  Commonly known as:  NORCO  Take 1-2 tablets by mouth every 6 (six) hours as needed.        Followup: He will followup in 1 week for catheter removal  and to discuss his surgical pathology results.

## 2013-10-11 ENCOUNTER — Telehealth: Payer: Self-pay | Admitting: Urology

## 2013-10-11 NOTE — Telephone Encounter (Signed)
Pt's wife called noting patient was fatigued and had temp to 101. No n/v, but didn't eat much today. Had BM yesterday. No redness or purulence from wounds. No lower extremity swelling. No upper resp symptoms. Advised patient's wife that it is difficult for me to make a diagnosis over the phone, and given his nonspecific symptoms and h/o PE during pneumonia several years ago, the safest thing would be to go to the ER to get assessed. If patient's wants could come to clinic tomorrow but my recommendation would be to go to [facility] ER. Patient's wife said she would discuss with the patient.

## 2013-10-15 ENCOUNTER — Inpatient Hospital Stay (HOSPITAL_COMMUNITY)
Admission: EM | Admit: 2013-10-15 | Discharge: 2013-10-20 | DRG: 175 | Disposition: A | Discharge status: Home / Self Care | Payer: 59 | Attending: Internal Medicine | Admitting: Internal Medicine

## 2013-10-15 ENCOUNTER — Inpatient Hospital Stay (HOSPITAL_COMMUNITY): Payer: 59

## 2013-10-15 ENCOUNTER — Encounter (HOSPITAL_COMMUNITY): Payer: Self-pay | Admitting: Radiology

## 2013-10-15 ENCOUNTER — Emergency Department (HOSPITAL_COMMUNITY): Payer: 59

## 2013-10-15 DIAGNOSIS — R0602 Shortness of breath: Secondary | ICD-10-CM | POA: Diagnosis present

## 2013-10-15 DIAGNOSIS — I2699 Other pulmonary embolism without acute cor pulmonale: Principal | ICD-10-CM | POA: Diagnosis present

## 2013-10-15 DIAGNOSIS — M7989 Other specified soft tissue disorders: Secondary | ICD-10-CM | POA: Diagnosis present

## 2013-10-15 DIAGNOSIS — G4730 Sleep apnea, unspecified: Secondary | ICD-10-CM | POA: Diagnosis present

## 2013-10-15 DIAGNOSIS — E876 Hypokalemia: Secondary | ICD-10-CM | POA: Diagnosis present

## 2013-10-15 DIAGNOSIS — J189 Pneumonia, unspecified organism: Secondary | ICD-10-CM | POA: Diagnosis present

## 2013-10-15 DIAGNOSIS — Z87891 Personal history of nicotine dependence: Secondary | ICD-10-CM

## 2013-10-15 DIAGNOSIS — Z9889 Other specified postprocedural states: Secondary | ICD-10-CM

## 2013-10-15 DIAGNOSIS — Z9079 Acquired absence of other genital organ(s): Secondary | ICD-10-CM

## 2013-10-15 DIAGNOSIS — J9600 Acute respiratory failure, unspecified whether with hypoxia or hypercapnia: Secondary | ICD-10-CM | POA: Diagnosis present

## 2013-10-15 DIAGNOSIS — Z79899 Other long term (current) drug therapy: Secondary | ICD-10-CM

## 2013-10-15 DIAGNOSIS — Z86711 Personal history of pulmonary embolism: Secondary | ICD-10-CM

## 2013-10-15 DIAGNOSIS — I10 Essential (primary) hypertension: Secondary | ICD-10-CM | POA: Diagnosis present

## 2013-10-15 DIAGNOSIS — D638 Anemia in other chronic diseases classified elsewhere: Secondary | ICD-10-CM | POA: Diagnosis present

## 2013-10-15 LAB — URINALYSIS, ROUTINE W REFLEX MICROSCOPIC
Glucose, UA: NEGATIVE mg/dL
Ketones, ur: 15 mg/dL — AB
Nitrite: NEGATIVE
Protein, ur: 30 mg/dL — AB
Urobilinogen, UA: 0.2 mg/dL (ref 0.0–1.0)
pH: 5.5 (ref 5.0–8.0)

## 2013-10-15 LAB — CBC
HCT: 28.7 % — ABNORMAL LOW (ref 39.0–52.0)
MCH: 30 pg (ref 26.0–34.0)
MCHC: 33.8 g/dL (ref 30.0–36.0)
MCV: 88.9 fL (ref 78.0–100.0)
Platelets: 147 10*3/uL — ABNORMAL LOW (ref 150–400)
RDW: 12.6 % (ref 11.5–15.5)
WBC: 13.1 10*3/uL — ABNORMAL HIGH (ref 4.0–10.5)

## 2013-10-15 LAB — BASIC METABOLIC PANEL
BUN: 14 mg/dL (ref 6–23)
Calcium: 9.1 mg/dL (ref 8.4–10.5)
Chloride: 101 mEq/L (ref 96–112)
Creatinine, Ser: 1.14 mg/dL (ref 0.50–1.35)
GFR calc Af Amer: 80 mL/min — ABNORMAL LOW (ref >90)
GFR calc non Af Amer: 69 mL/min — ABNORMAL LOW (ref >90)

## 2013-10-15 LAB — POCT I-STAT TROPONIN I: Troponin i, poc: 0.01 ng/mL (ref 0.00–0.08)

## 2013-10-15 LAB — LACTIC ACID, PLASMA: Lactic Acid, Venous: 1.2 mmol/L (ref 0.5–2.2)

## 2013-10-15 LAB — URINE MICROSCOPIC-ADD ON

## 2013-10-15 MED ORDER — ONDANSETRON HCL 4 MG PO TABS: 4.0000 mg | ORAL_TABLET | Freq: Four times a day (QID) | ORAL | Status: DC | PRN

## 2013-10-15 MED ORDER — HYDROCODONE-ACETAMINOPHEN 5-325 MG PO TABS
1.0000 | ORAL_TABLET | Freq: Four times a day (QID) | ORAL | Status: DC | PRN
  Administered 2013-10-16 – 2013-10-20 (×7): 2 via ORAL
  Filled 2013-10-15 (×7): qty 2

## 2013-10-15 MED ORDER — IOHEXOL 350 MG/ML SOLN
100.0000 mL | Freq: Once | INTRAVENOUS | Status: AC | PRN
  Administered 2013-10-15: 100 mL via INTRAVENOUS

## 2013-10-15 MED ORDER — DOCUSATE SODIUM 100 MG PO CAPS
100.0000 mg | ORAL_CAPSULE | Freq: Two times a day (BID) | ORAL | Status: DC
  Administered 2013-10-16 – 2013-10-20 (×9): 100 mg via ORAL
  Filled 2013-10-15 (×11): qty 1

## 2013-10-15 MED ORDER — ACETAMINOPHEN 500 MG PO TABS
500.0000 mg | ORAL_TABLET | Freq: Four times a day (QID) | ORAL | Status: DC | PRN
  Administered 2013-10-16 – 2013-10-18 (×2): 500 mg via ORAL
  Filled 2013-10-15 (×2): qty 1

## 2013-10-15 MED ORDER — HEPARIN (PORCINE) IN NACL 100-0.45 UNIT/ML-% IJ SOLN
1600.0000 [IU]/h | INTRAMUSCULAR | Status: DC
  Administered 2013-10-15 – 2013-10-16 (×2): 1600 [IU]/h via INTRAVENOUS
  Filled 2013-10-15 (×3): qty 250

## 2013-10-15 MED ORDER — AMLODIPINE BESYLATE 5 MG PO TABS
5.0000 mg | ORAL_TABLET | Freq: Every morning | ORAL | Status: DC
  Administered 2013-10-16 – 2013-10-20 (×5): 5 mg via ORAL
  Filled 2013-10-15 (×5): qty 1

## 2013-10-15 MED ORDER — PIPERACILLIN-TAZOBACTAM 3.375 G IVPB 30 MIN
3.3750 g | Freq: Once | INTRAVENOUS | Status: AC
  Administered 2013-10-15: 3.375 g via INTRAVENOUS
  Filled 2013-10-15: qty 50

## 2013-10-15 MED ORDER — SODIUM CHLORIDE 0.9 % IV SOLN
INTRAVENOUS | Status: DC
  Administered 2013-10-15: 22:00:00 via INTRAVENOUS

## 2013-10-15 MED ORDER — ONDANSETRON HCL 4 MG/2ML IJ SOLN: 4.0000 mg | Freq: Four times a day (QID) | INTRAMUSCULAR | Status: DC | PRN

## 2013-10-15 MED ORDER — VANCOMYCIN HCL 10 G IV SOLR
1250.0000 mg | Freq: Once | INTRAVENOUS | Status: AC
  Administered 2013-10-15: 23:00:00 1250 mg via INTRAVENOUS
  Filled 2013-10-15: qty 1250

## 2013-10-15 MED ORDER — SODIUM CHLORIDE 0.9 % IJ SOLN
3.0000 mL | Freq: Two times a day (BID) | INTRAMUSCULAR | Status: DC
  Administered 2013-10-16 (×2): 3 mL via INTRAVENOUS

## 2013-10-15 MED ORDER — HEPARIN BOLUS VIA INFUSION
5000.0000 [IU] | Freq: Once | INTRAVENOUS | Status: AC
  Administered 2013-10-15: 22:00:00 5000 [IU] via INTRAVENOUS
  Filled 2013-10-15: qty 5000

## 2013-10-15 NOTE — H&P (Signed)
[facility] History and Physical  Hoover Grewe WGN:[id number] DOB: 1956/06/21 DOA: 10/15/2013  Referring physician: PCP: No primary provider on file.  Specialists:   Chief Complaint: SOB  HPI: Dwayne Lawrence is a 57 y.o. male with PMH of HTN, h/o PE, s/p radical prostatectomy (10/07/13) presented with SOB, fever, associated with pleuritic chest pain, some non productive cough; ED work up showed pneumonia; but high suspicions for PE; no nausea vomiting, diarrhea, no focal neuro symptoms   Review of Systems: The patient denies anorexia, fever, weight loss,, vision loss, decreased hearing, hoarseness, chest pain, syncope, dyspnea on exertion, peripheral edema, balance deficits, hemoptysis, abdominal pain, melena, hematochezia, severe indigestion/heartburn, hematuria, incontinence, genital sores, muscle weakness, suspicious skin lesions, transient blindness, difficulty walking, depression, unusual weight change, abnormal bleeding, enlarged lymph nodes, angioedema, and breast masses.    Past Medical History  Diagnosis Date  . Hypertension   . Sleep apnea     does not use cpap, last sleep study more than 3 yrs ago  . Gout no recent flares  . Pulmonary embolus 2009    right lung   Past Surgical History  Procedure Laterality Date  . Prostate biopsy  2013 and oct 2014  . Robot assisted laparoscopic radical prostatectomy N/A 10/07/2013    Procedure: ROBOTIC ASSISTED LAPAROSCOPIC RADICAL PROSTATECTOMY;  Surgeon: Valetta Fuller, MD;  Location: [facility];  Service: Urology;  Laterality: N/A;  . Lymphadenectomy Bilateral 10/07/2013    Procedure: LYMPHADENECTOMY " BILATERAL PELVIC LYMPH NODE DISSECTION";  Surgeon: Valetta Fuller, MD;  Location: [facility];  Service: Urology;  Laterality: Bilateral;   Social History:  reports that he quit smoking about 15 years ago. His smoking use included Pipe. He quit smokeless tobacco use about 15 years ago. His smokeless tobacco use included Chew. He reports that  he drinks alcohol. He reports that he does not use illicit drugs. Home: where does patient live--home, ALF, SNF? and with whom if at home? Yes: Can patient participate in ADLs?  No Known Allergies  No family history on file. no CVA h/o (be sure to complete)  Prior to Admission medications   Medication Sig Start Date End Date Taking? Authorizing Provider  acetaminophen (TYLENOL) 500 MG tablet Take 500 mg by mouth every 6 (six) hours as needed.   Yes Historical Provider, MD  amLODipine (NORVASC) 5 MG tablet Take 5 mg by mouth every morning.   Yes Historical Provider, MD  ciprofloxacin (CIPRO) 500 MG tablet Take 1 tablet (500 mg total) by mouth 2 (two) times daily. Start day prior to office visit for foley removal 10/07/13  Yes Darol Destine. Myer Haff, PA-C  docusate sodium (COLACE) 100 MG capsule Take 100 mg by mouth 2 (two) times daily.   Yes Historical Provider, MD  HYDROcodone-acetaminophen (NORCO) 5-325 MG per tablet Take 1-2 tablets by mouth every 6 (six) hours as needed. 10/07/13  Yes Darol Destine. Yarbrough, PA-C  magnesium citrate SOLN Take 1 Bottle by mouth once.    Historical Provider, MD   Physical Exam: Filed Vitals:   10/15/13 1923  BP: 124/76  Pulse: 100  Temp: 99.9 F (37.7 C)  Resp: 25     General:  alert  Eyes: EOM-i  ENT: no oral ul;cers   Neck: supple   Cardiovascular: s1,s2 rrr  Respiratory: few rhonchi R LL  Abdomen: soft, post surgical wound clean   Skin: no rash  Musculoskeletal: no LE edema  Psychiatric: no hallucinations   Neurologic: CN 2-12 intact, motor 5/5 BL symmetric  Labs on Admission:  Basic Metabolic Panel:  Recent Labs Lab 10/15/13 1852  NA 135  K 3.7  CL 101  CO2 22  GLUCOSE 124*  BUN 14  CREATININE 1.14  CALCIUM 9.1   Liver Function Tests: No results found for this basename: AST, ALT, ALKPHOS, BILITOT, PROT, ALBUMIN,  in the last 168 hours No results found for this basename: LIPASE, AMYLASE,  in the last 168 hours No  results found for this basename: AMMONIA,  in the last 168 hours CBC:  Recent Labs Lab 10/15/13 1852  WBC 13.1*  HGB 9.7*  HCT 28.7*  MCV 88.9  PLT 147*   Cardiac Enzymes: No results found for this basename: CKTOTAL, CKMB, CKMBINDEX, TROPONINI,  in the last 168 hours  BNP (last 3 results) No results found for this basename: PROBNP,  in the last 8760 hours CBG: No results found for this basename: GLUCAP,  in the last 168 hours  Radiological Exams on Admission: Dg Chest Port 1 View  10/15/2013   CLINICAL DATA:  Shortness of breath.  EXAM: PORTABLE CHEST - 1 VIEW  COMPARISON:  September 23, 2013.  FINDINGS: Hypoinflation of the lungs is noted. Cardiomediastinal silhouette appears normal. There is noted a opacity seen laterally in the right lung base most consistent with subsegmental atelectasis or pneumonia. Mild left basilar opacity is also noted concerning for subsegmental atelectasis or pneumonia. No pneumothorax is noted. Right perihilar opacity is noted which may represent pneumonia or atelectasis.  IMPRESSION: Mild bilateral basilar opacities are noted, with right worse than left, concerning for pneumonia or subsegmental atelectasis. Right perihilar opacity is noted as well.   Electronically Signed   By: Roque Lias M.D.   On: 10/15/2013 19:31    EKG: Independently reviewed. Sinus tachy  Assessment/Plan Active Problems:   HCAP (healthcare-associated pneumonia)   S/P prostatectomy   SOB (shortness of breath)   HTN (hypertension)  57 y.o. male with PMH of HTN, h/o PE, s/p radical prostatectomy (10/07/13) presented with SOB, fever, associated with pleuritic chest pain, some non productive cough; ED work up showed pneumonia;  1. HCAP/Sepsis/SIRS; recent hospitalization; CXR: Mild bilateral basilar opacities are noted, with right worse than  eft, concerning for pneumonia or subsegmental atelectasis -started IV atx (vanc, zosyn); f/u blood c/s; cont oxygen, bronchodilators prn   -need to r/o VTE (h/o PE, recent surgery, sinus tachy); obtain stat CTA, start IV heparin until r/o PE; change to prophylaxis dose if no PE   2. S/p recent radical prostatectomy  -post op wound clean; cont monitoring; outpatient f/u   3. Anemia likely post op blood loss; no s/s of acute bleeding; check iron profile;   4. HTN; cont home regimen    None:  if consultant consulted, please document name and whether formally or informally consulted  Code Status: full (must indicate code status--if unknown or must be presumed, indicate so) Family Communication: wife at t he bedside (indicate person spoken with, if applicable, with phone number if by telephone) Disposition Plan: home when ready (indicate anticipated LOS)  Time spent: >45 minutes  Esperanza Sheets [facility] Pager [phone]  If 7PM-7AM, please contact night-coverage [email or web address] Password [facility] 10/15/2013, 8:15 PM

## 2013-10-15 NOTE — Progress Notes (Signed)
ANTICOAGULATION CONSULT NOTE - Initial Consult  Pharmacy Consult for Heparin Indication: r/o PE  No Known Allergies  Patient Measurements:   Total Body Weight: 109.6 kg as of 10/07/2013 Heparin Dosing Weight: 102 kg  Vital Signs: Temp: 99.9 F (37.7 C) (11/27 1923) Temp src: Oral (11/27 1923) BP: 124/76 mmHg (11/27 1923) Pulse Rate: 100 (11/27 1923)  Labs:  Recent Labs  10/15/13 1852  HGB 9.7*  HCT 28.7*  PLT 147*  CREATININE 1.14    The CrCl is unknown because both a height and weight (above a minimum accepted value) are required for this calculation.   Medical History: Past Medical History  Diagnosis Date  . Hypertension   . Sleep apnea     does not use cpap, last sleep study more than 3 yrs ago  . Gout no recent flares  . Pulmonary embolus 2009    right lung      Assessment: 55 yoM with PMH of HTN, PE in 2009, s/p radical prostatectomy 11/19 presents with SOB, fever, pleuritic chest pain.  CXR shows PNA but MD notes high suspicion for PE.  Starting IV heparin until PE ruled out.    Baseline anticoagulation labs collected  Hgb 9.7, Platelets 147K, note surgery 11/19  SCr 1.14, CrCl~100  Goal of Therapy:  Heparin level 0.3-0.7 units/ml Monitor platelets by anticoagulation protocol: Yes   Plan:   Start heparin with 5000 unit IV bolus x 1 followed by infusion starting at 1600 units/hr (16 mL/hr).    Check heparin level 6 hours following start of heparin.  F/u results of CTA.  Daily Heparin level and CBC while on heparin.  Clance Boll 10/15/2013,8:20 PM

## 2013-10-15 NOTE — ED Notes (Signed)
Pt with CT will be transported to floor upon return.

## 2013-10-15 NOTE — ED Notes (Signed)
MD at bedside. 

## 2013-10-15 NOTE — ED Notes (Addendum)
Patient with Hx of PE in 2009 and Hx of prostate surgery last week reports to ED for SOB and "sharp lung pain that feels like it felt with the PE," as well as right lower quadrant abdominal pain. Patient does not take blood thinners. Patient reports pain with inspiration. Patient states that pt's MD states that abdominal pain is due to scarring from surgery.

## 2013-10-15 NOTE — ED Provider Notes (Signed)
CSN: [id number]     Arrival date & time 10/15/13  1825 History   First MD Initiated Contact with Patient 10/15/13 1837     Chief Complaint  Patient presents with  . Shortness of Breath  . Chest Pain  . Abdominal Pain   (Consider location/radiation/quality/duration/timing/severity/associated sxs/prior Treatment) Patient is a 57 y.o. male presenting with shortness of breath, chest pain, and abdominal pain. The history is provided by the patient.  Shortness of Breath Severity:  Moderate Onset quality:  Gradual Timing:  Constant Progression:  Worsening Chronicity:  New Context comment:  Recent surgery last week Relieved by:  Nothing Worsened by:  Nothing tried Associated symptoms: abdominal pain (from surgical sitets), chest pain (R sided, burning), cough and fever (101.3 - Tmax 4 days ago)   Associated symptoms: no ear pain, no rash and no vomiting   Risk factors: recent surgery   Chest Pain Associated symptoms: abdominal pain (from surgical sitets), cough, fever (101.3 - Tmax 4 days ago) and shortness of breath   Associated symptoms: not vomiting   Abdominal Pain Associated symptoms: chest pain (R sided, burning), cough, fever (101.3 - Tmax 4 days ago) and shortness of breath   Associated symptoms: no diarrhea and no vomiting     Past Medical History  Diagnosis Date  . Hypertension   . Sleep apnea     does not use cpap, last sleep study more than 3 yrs ago  . Gout no recent flares  . Pulmonary embolus 2009    right lung   Past Surgical History  Procedure Laterality Date  . Prostate biopsy  2013 and oct 2014  . Robot assisted laparoscopic radical prostatectomy N/A 10/07/2013    Procedure: ROBOTIC ASSISTED LAPAROSCOPIC RADICAL PROSTATECTOMY;  Surgeon: Valetta Fuller, MD;  Location: [facility];  Service: Urology;  Laterality: N/A;  . Lymphadenectomy Bilateral 10/07/2013    Procedure: LYMPHADENECTOMY " BILATERAL PELVIC LYMPH NODE DISSECTION";  Surgeon: Valetta Fuller, MD;   Location: [facility];  Service: Urology;  Laterality: Bilateral;   No family history on file. History  Substance Use Topics  . Smoking status: Former Smoker    Types: Pipe    Quit date: 11/19/1997  . Smokeless tobacco: Former Neurosurgeon    Types: Chew    Quit date: 11/19/1997  . Alcohol Use: Yes     Comment: 2 drinks per day    Review of Systems  Constitutional: Positive for fever (101.3 - Tmax 4 days ago).  HENT: Negative for ear pain.   Respiratory: Positive for cough and shortness of breath.   Cardiovascular: Positive for chest pain (R sided, burning).  Gastrointestinal: Positive for abdominal pain (from surgical sitets). Negative for vomiting and diarrhea.  Skin: Negative for rash.  All other systems reviewed and are negative.    Allergies  Review of patient's allergies indicates no known allergies.  Home Medications   Current Outpatient Rx  Name  Route  Sig  Dispense  Refill  . acetaminophen (TYLENOL) 500 MG tablet   Oral   Take 500 mg by mouth every 6 (six) hours as needed.         Marland Kitchen amLODipine (NORVASC) 5 MG tablet   Oral   Take 5 mg by mouth every morning.         . ciprofloxacin (CIPRO) 500 MG tablet   Oral   Take 1 tablet (500 mg total) by mouth 2 (two) times daily. Start day prior to office visit for foley removal   6  tablet   0   . docusate sodium (COLACE) 100 MG capsule   Oral   Take 100 mg by mouth 2 (two) times daily.         Marland Kitchen HYDROcodone-acetaminophen (NORCO) 5-325 MG per tablet   Oral   Take 1-2 tablets by mouth every 6 (six) hours as needed.   30 tablet   0   . magnesium citrate SOLN   Oral   Take 1 Bottle by mouth once.          BP 124/76  Pulse 100  Temp(Src) 99.9 F (37.7 C) (Oral)  Resp 25  SpO2 93% Physical Exam  Nursing note and vitals reviewed. Constitutional: He is oriented to person, place, and time. He appears well-developed and well-nourished. No distress.  HENT:  Head: Normocephalic and atraumatic.  Mouth/Throat: No  oropharyngeal exudate.  Eyes: EOM are normal. Pupils are equal, round, and reactive to light.  Neck: Normal range of motion. Neck supple.  Cardiovascular: Normal rate and regular rhythm.  Exam reveals no friction rub.   No murmur heard. Pulmonary/Chest: He is in respiratory distress (mild). He has no decreased breath sounds. He has no wheezes. He has rhonchi (mild, diffuse). He has no rales.  Abdominal: He exhibits no distension. There is no tenderness. There is no rebound.  Musculoskeletal: Normal range of motion. He exhibits no edema.  Neurological: He is alert and oriented to person, place, and time.  Skin: He is not diaphoretic.    ED Course  Procedures (including critical care time) Labs Review Labs Reviewed  CBC - Abnormal; Notable for the following:    WBC 13.1 (*)    RBC 3.23 (*)    Hemoglobin 9.7 (*)    HCT 28.7 (*)    Platelets 147 (*)    All other components within normal limits  BASIC METABOLIC PANEL - Abnormal; Notable for the following:    Glucose, Bld 124 (*)    GFR calc non Af Amer 69 (*)    GFR calc Af Amer 80 (*)    All other components within normal limits  CULTURE, BLOOD (ROUTINE X 2)  CULTURE, BLOOD (ROUTINE X 2)  URINE CULTURE  URINALYSIS, ROUTINE W REFLEX MICROSCOPIC  LACTIC ACID, PLASMA  POCT I-STAT TROPONIN I   Imaging Review Dg Chest Port 1 View  10/15/2013   CLINICAL DATA:  Shortness of breath.  EXAM: PORTABLE CHEST - 1 VIEW  COMPARISON:  September 23, 2013.  FINDINGS: Hypoinflation of the lungs is noted. Cardiomediastinal silhouette appears normal. There is noted a opacity seen laterally in the right lung base most consistent with subsegmental atelectasis or pneumonia. Mild left basilar opacity is also noted concerning for subsegmental atelectasis or pneumonia. No pneumothorax is noted. Right perihilar opacity is noted which may represent pneumonia or atelectasis.  IMPRESSION: Mild bilateral basilar opacities are noted, with right worse than left,  concerning for pneumonia or subsegmental atelectasis. Right perihilar opacity is noted as well.   Electronically Signed   By: Roque Lias M.D.   On: 10/15/2013 19:31    EKG Interpretation    Date/Time:  Thursday October 15 2013 18:38:58 EST Ventricular Rate:  106 PR Interval:  155 QRS Duration: 74 QT Interval:  332 QTC Calculation: 441 R Axis:   9 Text Interpretation:  Sinus tachycardia Abnormal R-wave progression, early transition Left ventricular hypertrophy Borderline T abnormalities, anterior leads Confirmed by Gwendolyn Grant  MD, June Rode ([id number]) on 10/15/2013 6:42:41 PM  MDM   1. HCAP (healthcare-associated pneumonia)   2. HTN (hypertension)   3. S/P prostatectomy   4. SOB (shortness of breath)    74M presents with fever, SOB. Recently robotic prostatecomty last week. Fevers for 4 days, SOB worsening today. Here with some mild diffuse rhonchi, mild tachypnea, O2 sats 93-95% on room air. Belly with well-healing surgical scars, no cellulitis, no frank abdominal pain. CXR shows bilateral PNA. Labs show leukocytosis. Vanc/Zosyn given. Hospitalist admitting.  Hospitalist ordered PE scan - radiologist called me with results showing bilateral PEs. I informed hospitalist of this finding as patient was out of ED.    Dagmar Hait, MD 10/15/13 2131

## 2013-10-15 NOTE — Progress Notes (Signed)
[facility] PROGRESS NOTE  Dwayne Lawrence YNW:[id number] DOB: 1956-06-14 DOA: 10/15/2013 PCP: No primary provider on file.  Assessment/Plan: Addendum:  CTA showed: PE; cont IV heparin; obtain echo, [place] legs   Dwayne Lawrence  [facility] Pager [phone]. If 7PM-7AM, please contact night-coverage at [email or web address], password [facility] 10/15/2013, 9:12 PM  LOS: 0 days

## 2013-10-16 DIAGNOSIS — R0602 Shortness of breath: Secondary | ICD-10-CM

## 2013-10-16 DIAGNOSIS — I369 Nonrheumatic tricuspid valve disorder, unspecified: Secondary | ICD-10-CM

## 2013-10-16 DIAGNOSIS — I2699 Other pulmonary embolism without acute cor pulmonale: Secondary | ICD-10-CM

## 2013-10-16 LAB — CBC
HCT: 33.6 % — ABNORMAL LOW (ref 39.0–52.0)
Hemoglobin: 11.4 g/dL — ABNORMAL LOW (ref 13.0–17.0)
MCHC: 33.9 g/dL (ref 30.0–36.0)
MCV: 88.2 fL (ref 78.0–100.0)
RBC: 3.81 MIL/uL — ABNORMAL LOW (ref 4.22–5.81)
RDW: 12.8 % (ref 11.5–15.5)
WBC: 9.8 10*3/uL (ref 4.0–10.5)

## 2013-10-16 LAB — IRON AND TIBC
Iron: 16 ug/dL — ABNORMAL LOW (ref 42–135)
TIBC: 217 ug/dL (ref 215–435)

## 2013-10-16 LAB — URINE CULTURE
Colony Count: NO GROWTH
Special Requests: NORMAL

## 2013-10-16 LAB — BASIC METABOLIC PANEL
BUN: 10 mg/dL (ref 6–23)
CO2: 20 mEq/L (ref 19–32)
Chloride: 106 mEq/L (ref 96–112)
Creatinine, Ser: 1 mg/dL (ref 0.50–1.35)
GFR calc Af Amer: >90 mL/min (ref >90)
Sodium: 138 mEq/L (ref 135–145)

## 2013-10-16 LAB — FERRITIN: Ferritin: 586 ng/mL — ABNORMAL HIGH (ref 22–322)

## 2013-10-16 MED ORDER — VANCOMYCIN HCL 10 G IV SOLR
1500.0000 mg | Freq: Once | INTRAVENOUS | Status: AC
  Administered 2013-10-16: 14:00:00 1500 mg via INTRAVENOUS
  Filled 2013-10-16: qty 1500

## 2013-10-16 MED ORDER — VANCOMYCIN HCL IN DEXTROSE 1-5 GM/200ML-% IV SOLN
1000.0000 mg | Freq: Two times a day (BID) | INTRAVENOUS | Status: DC
  Administered 2013-10-16 – 2013-10-17 (×3): 1000 mg via INTRAVENOUS
  Filled 2013-10-16 (×4): qty 200

## 2013-10-16 MED ORDER — DEXTROSE 5 % IV SOLN
1.0000 g | Freq: Three times a day (TID) | INTRAVENOUS | Status: DC
  Administered 2013-10-16 – 2013-10-18 (×6): 1 g via INTRAVENOUS
  Filled 2013-10-16 (×7): qty 1

## 2013-10-16 MED ORDER — MORPHINE SULFATE 2 MG/ML IJ SOLN
1.0000 mg | INTRAMUSCULAR | Status: DC | PRN
  Administered 2013-10-16 (×3): 1 mg via INTRAVENOUS
  Administered 2013-10-17 – 2013-10-19 (×3): 2 mg via INTRAVENOUS
  Filled 2013-10-16 (×6): qty 1

## 2013-10-16 MED ORDER — PATIENT'S GUIDE TO USING COUMADIN BOOK
Freq: Once | Status: AC
  Administered 2013-10-16: 14:00:00
  Filled 2013-10-16: qty 1

## 2013-10-16 MED ORDER — WARFARIN VIDEO
Freq: Once | Status: AC
  Administered 2013-10-16: 14:00:00

## 2013-10-16 MED ORDER — HEPARIN (PORCINE) IN NACL 100-0.45 UNIT/ML-% IJ SOLN
1750.0000 [IU]/h | INTRAMUSCULAR | Status: DC
  Administered 2013-10-16: 1750 [IU]/h via INTRAVENOUS
  Filled 2013-10-16 (×3): qty 250

## 2013-10-16 MED ORDER — WARFARIN SODIUM 7.5 MG PO TABS
7.5000 mg | ORAL_TABLET | Freq: Once | ORAL | Status: AC
  Administered 2013-10-16: 17:00:00 7.5 mg via ORAL
  Filled 2013-10-16: qty 1

## 2013-10-16 MED ORDER — WARFARIN - PHARMACIST DOSING INPATIENT
Freq: Every day | Status: DC
  Administered 2013-10-17: 18:00:00

## 2013-10-16 NOTE — Progress Notes (Signed)
Bilateral lower extremity venous duplex completed.  Right:  DVT noted in the posterior tibial and peroneal veins.  No evidence of superficial thrombosis.  No Baker's cyst.  Left:  No evidence of DVT, superficial thrombosis, or Baker's cyst.   

## 2013-10-16 NOTE — Progress Notes (Signed)
D- patient complained of pain and unable to take deep breaths.    A- obtained a set of vitals which were stable, applied 2L of oxygen, and notified for breakthrough pain medication. IV morphine was ordered and given.  R- patient states his pain is down to a 5 and is resting now, will continue to monitor.  Ernesta Amble, RN

## 2013-10-16 NOTE — Progress Notes (Signed)
ANTICOAGULATION CONSULT NOTE - Follow Up Consult  Pharmacy Consult for Heparin Indication: pulmonary embolus  No Known Allergies  Patient Measurements: Height: 6\' 1"  (185.4 cm) Weight: 238 lb 12.1 oz (108.3 kg) IBW/kg (Calculated) : 79.9 Heparin Dosing Weight:   Vital Signs: Temp: 99.3 F (37.4 C) (11/28 0436) Temp src: Oral (11/28 0436) BP: 121/75 mmHg (11/28 0436) Pulse Rate: 83 (11/28 0436)  Labs:  Recent Labs  10/15/13 1852 10/15/13 2026 10/16/13 0430  HGB 9.7*  --  11.4*  HCT 28.7*  --  33.6*  PLT 147*  --  101*  APTT  --  30  --   LABPROT  --  15.3*  --   INR  --  1.24  --   HEPARINUNFRC  --   --  0.34  CREATININE 1.14  --  1.00    Estimated Creatinine Clearance: 102.7 ml/min (by C-G formula based on Cr of 1).   Medications:  Infusions:  . sodium chloride 75 mL/hr at 10/15/13 2353  . heparin 1,600 Units/hr (10/15/13 2132)    Assessment: Patient with heparin at goal.  No issues per RN.  Goal of Therapy:  Heparin level 0.3-0.7 units/ml Monitor platelets by anticoagulation protocol: Yes   Plan:  Continue heparin at current rate, recheck level at [address], Martha Crowford 10/16/2013,5:56 AM

## 2013-10-16 NOTE — Care Management Note (Addendum)
    Page 1 of 1   10/20/2013     1:03:28 PM   CARE MANAGEMENT NOTE 10/20/2013  Patient:  Dwayne Lawrence, Dwayne Lawrence   Account Number:  [id number]  Date Initiated:  10/16/2013  Documentation initiated by:  Lanier Clam  Subjective/Objective Assessment:   57 Y/O M ADMITTED W/SOB.BILAT PE.     Action/Plan:   FROM HOME.HAS PCP,PHARMACY.   Anticipated DC Date:  10/20/2013   Anticipated DC Plan:  HOME/SELF CARE      DC Planning Services  CM consult      Choice offered to / List presented to:     DME arranged  Levan Hurst      DME agency  [facility]        Status of service:  Completed, signed off Medicare Important Message given?   (If response is "NO", the following Medicare IM given date fields will be blank) Date Medicare IM given:   Date Additional Medicare IM given:    Discharge Disposition:  HOME/SELF CARE  Per UR Regulation:  Reviewed for med. necessity/level of care/duration of stay  If discussed at Long Length of Stay Meetings, dates discussed:    Comments:  10/20/13 Lanier Clam RN BSN NCM [phone] PT-RW.RW ORDERED.LEFT VM [facility] DME REP OF DC & ORDER.THEY WILL BRING RW TO RM.  10/19/13 Edda Orea RN,BSN NCM [phone] BILAT PE,DVT-HEPARIN GTT.INR 1.2 D/C PLAN HOME.  10/16/13 Jvon Meroney RN,BSN NCM [phone]

## 2013-10-16 NOTE — Progress Notes (Signed)
Patient ID: Dwayne Lawrence, male   DOB: 27-Dec-1955, 57 y.o.   MRN: [id number] [facility] PROGRESS NOTE  Dwayne Lawrence [id number] DOB: 27-Apr-1956 DOA: 10/15/2013 PCP: No primary provider on file.  Brief narrative: 57 y.o. male with PMH of HTN, h/o PE, s/p radical prostatectomy (10/07/13) presented with SOB, fever, associated with pleuritic chest pain, some non productive cough; ED work up showed pneumonia and extensive bilateral PE; no nausea vomiting, diarrhea, no focal neuro symptoms   Active Problems:   Acute hypoxic respiratory failure - secondary to HCAP and bilateral PE - management as noted below    HCAP (healthcare-associated pneumonia) - pt clinically improving, with low grade fever 99.3 F, WBC within normal limits this AM - continue broad spectrum ABX day #2 - sputum analysis pending - urine legionella and strep pneumo pending    Bilateral PE - with history of PE and was on Coumadin in the past - pt has family history of PE as well - continue Heparin with transition to Coumadin per pharmacy    S/P prostatectomy - done 11/20, pt denies any problems from that perspective    HTN (hypertension) - reasonable inpatient control   Anemia of chronic disease - Hg up from admission value  - CBC in AM  Consultants:  None  Procedures/Studies: Ct Angio Chest Pe W/cm &/or Wo Cm   10/15/2013  Extensive acute bilateral pulmonary embolism.  Nonspecific bilateral lower lobe infiltrates, with differential diagnosis including pulmonary infarcts and pneumonia.   Dg Chest Port 1 View   10/15/2013  Mild bilateral basilar opacities are noted, with right worse than left, concerning for pneumonia or subsegmental atelectasis. Right perihilar opacity.  Antibiotics:  Vancomycin 11/27 -->  Zosyn 11/27 --> 11/28  Maxipime 11/28 -->   Code Status: Full Family Communication: Pt and wife at bedside Disposition Plan: Home when medically stable  HPI/Subjective: No events overnight.    Objective: Filed Vitals:   10/15/13 1850 10/15/13 1923 10/15/13 2100 10/16/13 0436  BP:  124/76 127/80 121/75  Pulse:  100 95 83  Temp: 99.8 F (37.7 C) 99.9 F (37.7 C) 99.1 F (37.3 C) 99.3 F (37.4 C)  TempSrc: Rectal Oral Oral Oral  Resp:  25 19 19   Height:   6\' 1"  (1.854 m)   Weight:   108.3 kg (238 lb 12.1 oz)   SpO2:  93% 98% 97%    Intake/Output Summary (Last 24 hours) at 10/16/13 1109 Last data filed at 10/16/13 0900  Gross per 24 hour  Intake    240 ml  Output    225 ml  Net     15 ml    Exam:   General:  Pt is alert, follows commands appropriately, not in acute distress  Cardiovascular: Regular rate and rhythm, S1/S2, no murmurs, no rubs, no gallops  Respiratory: Clear to auscultation bilaterally, bibasilar rhonchi   Abdomen: Soft, non tender, non distended, bowel sounds present, no guarding  Extremities: No edema, pulses DP and PT palpable bilaterally  Neuro: Grossly nonfocal  Data Reviewed: Basic Metabolic Panel:  Recent Labs Lab 10/15/13 1852 10/16/13 0430  NA 135 138  K 3.7 3.5  CL 101 106  CO2 22 20  GLUCOSE 124* 117*  BUN 14 10  CREATININE 1.14 1.00  CALCIUM 9.1 8.5   CBC:  Recent Labs Lab 10/15/13 1852 10/16/13 0430  WBC 13.1* 9.8  HGB 9.7* 11.4*  HCT 28.7* 33.6*  MCV 88.9 88.2  PLT 147* 101*    Scheduled Meds: .  amLODipine  5 mg Oral q morning - 10a  . docusate sodium  100 mg Oral BID  . sodium chloride  3 mL Intravenous Q12H   Continuous Infusions: . sodium chloride 75 mL/hr at 10/15/13 2353  . heparin 1,600 Units/hr (10/16/13 0954)   Dwayne Presto, MD  [facility] Pager [phone]  If 7PM-7AM, please contact night-coverage [email or web address] Password [email or web address] 10/16/2013, 11:09 AM   LOS: 1 day

## 2013-10-16 NOTE — Progress Notes (Signed)
Echocardiogram 2D Echocardiogram has been performed.  Dwayne Lawrence 10/16/2013, 4:15 PM

## 2013-10-16 NOTE — Progress Notes (Signed)
Picked up patient from another nurse, I agree with her assessment and will continue to monitor.  Susie Lewis, RN 

## 2013-10-16 NOTE — Progress Notes (Signed)
ANTIBIOTIC CONSULT NOTE - INITIAL  Pharmacy Consult for vancomycin/cefepime Indication: rule out pneumonia  No Known Allergies  Patient Measurements: Height: 6\' 1"  (185.4 cm) Weight: 238 lb 12.1 oz (108.3 kg) IBW/kg (Calculated) : 79.9 Adjusted Body Weight:   Vital Signs: Temp: 99.3 F (37.4 C) (11/28 0436) Temp src: Oral (11/28 0436) BP: 121/75 mmHg (11/28 0436) Pulse Rate: 83 (11/28 0436) Intake/Output from previous day: 11/27 0701 - 11/28 0700 In: -  Out: 225 [Urine:225] Intake/Output from this shift: Total I/O In: 240 [P.O.:240] Out: -   Labs:  Recent Labs  10/15/13 1852 10/16/13 0430  WBC 13.1* 9.8  HGB 9.7* 11.4*  PLT 147* 101*  CREATININE 1.14 1.00   Estimated Creatinine Clearance: 102.7 ml/min (by C-G formula based on Cr of 1). No results found for this basename: VANCOTROUGH, VANCOPEAK, VANCORANDOM, GENTTROUGH, GENTPEAK, GENTRANDOM, TOBRATROUGH, TOBRAPEAK, TOBRARND, AMIKACINPEAK, AMIKACINTROU, AMIKACIN,  in the last 72 hours   Microbiology: No results found for this or any previous visit (from the past 720 hour(s)).  Medical History: Past Medical History  Diagnosis Date  . Hypertension   . Sleep apnea     does not use cpap, last sleep study more than 3 yrs ago  . Gout no recent flares  . Pulmonary embolus 2009    right lung   Assessment: Dwayne Lawrence admitted with SOB, found to have extensive BL PE per CTA,  Also revealed BL lower lobe infiltrates possibly  from infarcts vs. Pneumonia, TR[facility] now adding vancomycin/cefepime, He is s/p radical proctectomy 11/19 for prostate cancer, given one time doses of vanco/zosyn in ED  11/27 >> zosyn x1 in ED 11/27 >>vancomycin >> 11/28 >> cefepime  >>    Tmax: 99.9 WBCs: 9.8 Renal: SCr = 1 for est CrCl = 175ml/min (C-G) and 11ml/min (N)  11/27 blood: Pending 11/27 urine: Pending / sputum: to be collected 11/28: Legionella Ag: ordered 11/28: s. pneumo Ag: ordered  Goal of Therapy:  Vancomycin trough level  15-20 mcg/ml  Plan:   Continue Cefepime 1gm VI q8h as ordered.  Vancomycin 1500mg  IV x 1 then 1gm IV q12h  Check vanco trough at steady state if remains on vancomycin  Juliette Alcide, PharmD, BCPS.   Pager: [phone]  10/16/2013,11:14 AM

## 2013-10-16 NOTE — Progress Notes (Signed)
ANTICOAGULATION CONSULT NOTE - Follow Up Consult  Pharmacy Consult for Heparin/warfarin Indication: pulmonary embolus  No Known Allergies  Patient Measurements: Height: 6\' 1"  (185.4 cm) Weight: 238 lb 12.1 oz (108.3 kg) IBW/kg (Calculated) : 79.9 Heparin Dosing Weight: 102.4  Vital Signs: Temp: 99.3 F (37.4 C) (11/28 0436) Temp src: Oral (11/28 0436) BP: 121/75 mmHg (11/28 0436) Pulse Rate: 83 (11/28 0436)  Labs:  Recent Labs  10/15/13 1852 10/15/13 2026 10/16/13 0430 10/16/13 1100  HGB 9.7*  --  11.4*  --   HCT 28.7*  --  33.6*  --   PLT 147*  --  101*  --   APTT  --  30  --   --   LABPROT  --  15.3*  --   --   INR  --  1.24  --   --   HEPARINUNFRC  --   --  0.34 0.30  CREATININE 1.14  --  1.00  --     Estimated Creatinine Clearance: 102.7 ml/min (by C-G formula based on Cr of 1).   Medications:  Infusions:  . heparin 1,600 Units/hr (10/16/13 0954)    Assessment: 57 yoM with PMH of HTN, PE in 2009, s/p radical prostatectomy 11/19 presents with SOB, fever, pleuritic chest pain, CT confirms PE. 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    Heparin level: confirmatory heparin level = 0.3 (low-end goal)  INR = 1.24 (baseline)  CBC: Hgb=11.4, Platelets = 101 (baseline = 147, so low prior to start of heparin)  No major drug interactions with warfarin noted, but started on broad spectrum antibiotics.   Goal of Therapy:  Heparin level 0.3-0.7 units/ml Monitor platelets by anticoagulation protocol: Yes   Plan:  Day #1 of minimum 5 day overlap for acute VTE   Increase heparin dripp to 1750 units/hr as heparin level at low-end therapeutic range (increase rate to prevent from falling < 0.3).  Daily heparin level and CBC  Coumadin 7.5mg  PO x 1   Daily INR  Coumadin education materials  Juliette Alcide, PharmD, BCPS.   Pager: [phone]  10/16/2013,12:33 PM

## 2013-10-17 LAB — BASIC METABOLIC PANEL
BUN: 9 mg/dL (ref 6–23)
Calcium: 8.5 mg/dL (ref 8.4–10.5)
GFR calc Af Amer: >90 mL/min (ref >90)
GFR calc non Af Amer: >90 mL/min (ref >90)
Glucose, Bld: 103 mg/dL — ABNORMAL HIGH (ref 70–99)

## 2013-10-17 LAB — CBC
HCT: 34.1 % — ABNORMAL LOW (ref 39.0–52.0)
Hemoglobin: 11.6 g/dL — ABNORMAL LOW (ref 13.0–17.0)
MCH: 29.8 pg (ref 26.0–34.0)
MCHC: 34 g/dL (ref 30.0–36.0)
RDW: 12.6 % (ref 11.5–15.5)

## 2013-10-17 LAB — HEPARIN LEVEL (UNFRACTIONATED): Heparin Unfractionated: 0.41 IU/mL (ref 0.30–0.70)

## 2013-10-17 MED ORDER — HEPARIN (PORCINE) IN NACL 100-0.45 UNIT/ML-% IJ SOLN
2100.0000 [IU]/h | INTRAMUSCULAR | Status: DC
  Administered 2013-10-18 – 2013-10-20 (×5): 2100 [IU]/h via INTRAVENOUS
  Filled 2013-10-17 (×8): qty 250

## 2013-10-17 MED ORDER — POTASSIUM CHLORIDE CRYS ER 20 MEQ PO TBCR
40.0000 meq | EXTENDED_RELEASE_TABLET | Freq: Once | ORAL | Status: AC
  Administered 2013-10-17: 09:00:00 40 meq via ORAL
  Filled 2013-10-17: qty 2

## 2013-10-17 MED ORDER — HEPARIN (PORCINE) IN NACL 100-0.45 UNIT/ML-% IJ SOLN
1900.0000 [IU]/h | INTRAMUSCULAR | Status: DC
  Administered 2013-10-17: 06:00:00 1900 [IU]/h via INTRAVENOUS
  Filled 2013-10-17 (×3): qty 250

## 2013-10-17 MED ORDER — WARFARIN SODIUM 10 MG PO TABS
10.0000 mg | ORAL_TABLET | Freq: Once | ORAL | Status: AC
  Administered 2013-10-17: 18:00:00 10 mg via ORAL
  Filled 2013-10-17: qty 1

## 2013-10-17 NOTE — Progress Notes (Signed)
ANTICOAGULATION CONSULT NOTE - Follow Up Consult  Pharmacy Consult for Heparin/Warfarin  Indication: pulmonary embolus  No Known Allergies  Patient Measurements: Height: 6\' 1"  (185.4 cm) Weight: 238 lb 12.1 oz (108.3 kg) IBW/kg (Calculated) : 79.9   Vital Signs: Temp: 98.7 F (37.1 C) (11/29 0530) Temp src: Oral (11/29 0530) BP: 125/60 mmHg (11/29 0530) Pulse Rate: 76 (11/29 0530)  Labs:  Recent Labs  10/15/13 1852 10/15/13 2026 10/16/13 0430 10/16/13 1100 10/17/13 0445  HGB 9.7*  --  11.4*  --  11.6*  HCT 28.7*  --  33.6*  --  34.1*  PLT 147*  --  101*  --  122*  APTT  --  30  --   --   --   LABPROT  --  15.3*  --   --  15.4*  INR  --  1.24  --   --  1.25  HEPARINUNFRC  --   --  0.34 0.30 0.27*  CREATININE 1.14  --  1.00  --  0.92    Estimated Creatinine Clearance: 111.6 ml/min (by C-G formula based on Cr of 0.92).   Medications:  Scheduled:  . amLODipine  5 mg Oral q morning - 10a  . ceFEPime (MAXIPIME) IV  1 g Intravenous Q8H  . docusate sodium  100 mg Oral BID  . sodium chloride  3 mL Intravenous Q12H  . vancomycin  1,000 mg Intravenous Q12H  . Warfarin - Pharmacist Dosing Inpatient   Does not apply q1800   Infusions:  . heparin 1,900 Units/hr (10/17/13 0616)   PRN: acetaminophen, HYDROcodone-acetaminophen, morphine injection, ondansetron (ZOFRAN) IV, ondansetron  Assessment: 62 yoM with PMH of HTN, PE in 2009, s/p radical prostatectomy 11/19 presents with SOB, fever, pleuritic chest pain, CT confirms PE. 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    Day # 2/5 Minimum overlap of warfarin/heparin for acute VTE Heparin level: 0.26, slightly below goal.  Increase to 2100 units/hr INR =  INR (1.25) remains at baseline, expected during initiation after 1 dose CBC: H/H okay, Plt 144 around baseline, 122 today.  No major drug interactions with warfarin noted   Goal of Therapy:  INR  2-3 Monitor platelets by anticoagulation protocol: Yes Heparin level 0.3-0.7   Plan:   Warfarin 10 mg po x 1 tonight at 1800   Increase heparin to 2100 units/hr, repeat heparin level in 6 hours at 2200    Continue Heparin/warfarin overlap for a minimum of 5 days and until INR is > 2 for at least 24 hours  Daily PT/INR/CBC  Monitor Renal function while on IV heparin, f/u plan for transition to lovenox?  Warfarin education to be completed    Rusty Villella, Loma Messing PharmD Pager #: [phone] 1:46 PM 10/17/2013 '

## 2013-10-17 NOTE — Progress Notes (Signed)
Patient ID: Dwayne Lawrence, male   DOB: Apr 14, 1956, 57 y.o.   MRN: [id number] [facility] PROGRESS NOTE  Jayzon Taras [id number] DOB: [birth year] DOA: 10/15/2013 PCP: No primary provider on file.  Brief narrative:  57 y.o. male with PMH of HTN, h/o PE, s/p radical prostatectomy (10/07/13) presented with SOB, fever, associated with pleuritic chest pain, some non productive cough; ED work up showed pneumonia and extensive bilateral PE; no nausea vomiting, diarrhea, no focal neuro symptoms.  Active Problems:  Acute hypoxic respiratory failure  - secondary to HCAP and bilateral PE  - management as noted below  HCAP (healthcare-associated pneumonia)  - pt clinically improving, with low grade fever 99.3 F, WBC within normal limits this AM  - continue broad spectrum ABX day #3 - sputum analysis pending  - urine legionella and strep pneumo pending  Bilateral PE - with history of PE and was on Coumadin in the past  - pt has family history of PE as well  - continue Heparin with transition to Coumadin per pharmacy  S/P prostatectomy  - done 11/20, pt denies any problems from that perspective  HTN (hypertension)  - reasonable inpatient control  Anemia of chronic disease  - Hg overall stable - no signs of active bleed - CBC in AM   Consultants:  None Procedures/Studies:  Ct Angio Chest Pe W/cm &/or Wo Cm 10/15/2013 Extensive acute bilateral pulmonary embolism. Nonspecific bilateral lower lobe infiltrates, with differential diagnosis including pulmonary infarcts and pneumonia.  Dg Chest Port 1 View 10/15/2013 Mild bilateral basilar opacities are noted, with right worse than left, concerning for pneumonia or subsegmental atelectasis. Right perihilar opacity. Antibiotics:  Vancomycin 11/27 -->  Zosyn 11/27 --> 11/28  Maxipime 11/28 -->   Code Status: Full  Family Communication: Pt and wife at bedside  Disposition Plan: Home when medically stable  HPI/Subjective: No events  overnight.   Objective: Filed Vitals:   10/16/13 1411 10/16/13 2110 10/16/13 2257 10/17/13 0530  BP: 121/75 127/74  125/60  Pulse: 81 86  76  Temp: 99.7 F (37.6 C) 100.8 F (38.2 C) 99.9 F (37.7 C) 98.7 F (37.1 C)  TempSrc: Oral Oral Oral Oral  Resp: 20 20  19   Height:      Weight:      SpO2: 93% 93%  96%    Intake/Output Summary (Last 24 hours) at 10/17/13 0645 Last data filed at 10/17/13 0631  Gross per 24 hour  Intake 2106.53 ml  Output    625 ml  Net 1481.53 ml    Exam:   General:  Pt is alert, follows commands appropriately, not in acute distress  Cardiovascular: Regular rate and rhythm, S1/S2, no murmurs, no rubs, no gallops  Respiratory: Clear to auscultation bilaterally, no wheezing, no crackles, no rhonchi  Abdomen: Soft, non tender, non distended, bowel sounds present, no guarding  Extremities: No edema, pulses DP and PT palpable bilaterally  Neuro: Grossly nonfocal  Data Reviewed: Basic Metabolic Panel:  Recent Labs Lab 10/15/13 1852 10/16/13 0430 10/17/13 0445  NA 135 138 138  K 3.7 3.5 3.4*  CL 101 106 106  CO2 22 20 19   GLUCOSE 124* 117* 103*  BUN 14 10 9   CREATININE 1.14 1.00 0.92  CALCIUM 9.1 8.5 8.5   CBC:  Recent Labs Lab 10/15/13 1852 10/16/13 0430 10/17/13 0445  WBC 13.1* 9.8 9.6  HGB 9.7* 11.4* 11.6*  HCT 28.7* 33.6* 34.1*  MCV 88.9 88.2 87.7  PLT 147* 101* 122*    Recent  Results (from the past 240 hour(s))  URINE CULTURE     Status: None   Collection Time    10/15/13  7:13 PM      Result Value Range Status   Specimen Description URINE, CLEAN CATCH   Final   Special Requests Normal   Final   Culture  Setup Time     Final   Value: 10/16/2013 01:32     Performed at [facility] Count     Final   Value: NO GROWTH     Performed at [facility]   Culture     Final   Value: NO GROWTH     Performed at [facility]   Report Status 10/16/2013 FINAL   Final     Scheduled Meds: .  amLODipine  5 mg Oral q morning - 10a  . ceFEPime (MAXIPIME) IV  1 g Intravenous Q8H  . docusate sodium  100 mg Oral BID  . sodium chloride  3 mL Intravenous Q12H  . vancomycin  1,000 mg Intravenous Q12H  . Warfarin - Pharmacist Dosing Inpatient   Does not apply q1800   Continuous Infusions: . heparin 1,900 Units/hr (10/17/13 1610)     [name], MD  [facility] Pager [phone]  If 7PM-7AM, please contact night-coverage [email or web address] Password [email or web address] 10/17/2013, 6:45 AM   LOS: 2 days

## 2013-10-17 NOTE — Progress Notes (Signed)
ANTICOAGULATION CONSULT NOTE - Follow Up Consult  Pharmacy Consult for Heparin Indication: pulmonary embolus  No Known Allergies  Patient Measurements: Height: 6\' 1"  (185.4 cm) Weight: 238 lb 12.1 oz (108.3 kg) IBW/kg (Calculated) : 79.9 Heparin Dosing Weight:   Vital Signs: Temp: 98.7 F (37.1 C) (11/29 0530) Temp src: Oral (11/29 0530) BP: 125/60 mmHg (11/29 0530) Pulse Rate: 76 (11/29 0530)  Labs:  Recent Labs  10/15/13 1852 10/15/13 2026 10/16/13 0430 10/16/13 1100 10/17/13 0445  HGB 9.7*  --  11.4*  --  11.6*  HCT 28.7*  --  33.6*  --  34.1*  PLT 147*  --  101*  --  122*  APTT  --  30  --   --   --   LABPROT  --  15.3*  --   --  15.4*  INR  --  1.24  --   --  1.25  HEPARINUNFRC  --   --  0.34 0.30 0.27*  CREATININE 1.14  --  1.00  --  0.92    Estimated Creatinine Clearance: 111.6 ml/min (by C-G formula based on Cr of 0.92).   Medications:  Infusions:  . heparin      Assessment: Patient with low heparin level.  No issues per RN.  Goal of Therapy:  Heparin level 0.3-0.7 units/ml Monitor platelets by anticoagulation protocol: Yes   Plan:  Increase heparin to 1900 units/hr, recheck level at [address], [place] 10/17/2013,6:04 AM

## 2013-10-17 NOTE — Progress Notes (Signed)
Pt called writer to room and states he sees little blisters under his steri strips. Steri strips were placed over a week ago and were partially off. Pt did indeed have several blisters under steri strips, cleaned with NS, telfa and clean dsg applied. Will call to MD attention in the am. Areas appear to be adhesive blisters related to steri strips. The center (above the umbilicus) is the only of size-measuring 2.0 cm circular. Pt denies any discomfort with areas, very pleasant and appreciative of care.

## 2013-10-18 LAB — BASIC METABOLIC PANEL
CO2: 21 mEq/L (ref 19–32)
Chloride: 105 mEq/L (ref 96–112)
GFR calc non Af Amer: 89 mL/min — ABNORMAL LOW (ref >90)
Glucose, Bld: 107 mg/dL — ABNORMAL HIGH (ref 70–99)
Potassium: 3.3 mEq/L — ABNORMAL LOW (ref 3.5–5.1)
Sodium: 137 mEq/L (ref 135–145)

## 2013-10-18 LAB — CBC
HCT: 32.1 % — ABNORMAL LOW (ref 39.0–52.0)
Hemoglobin: 11 g/dL — ABNORMAL LOW (ref 13.0–17.0)
RBC: 3.67 MIL/uL — ABNORMAL LOW (ref 4.22–5.81)
WBC: 9.2 10*3/uL (ref 4.0–10.5)

## 2013-10-18 LAB — PROTIME-INR
INR: 1.24 (ref 0.00–1.49)
Prothrombin Time: 15.3 seconds — ABNORMAL HIGH (ref 11.6–15.2)

## 2013-10-18 MED ORDER — BACITRACIN-NEOMYCIN-POLYMYXIN OINTMENT TUBE
TOPICAL_OINTMENT | CUTANEOUS | Status: DC | PRN
  Administered 2013-10-18: 10:00:00 via TOPICAL
  Filled 2013-10-18: qty 28

## 2013-10-18 MED ORDER — NEOMYCIN-POLYMYXIN-PRAMOXINE 1 % EX CREA: TOPICAL_CREAM | CUTANEOUS | Status: DC | PRN

## 2013-10-18 MED ORDER — LEVOFLOXACIN 500 MG PO TABS
500.0000 mg | ORAL_TABLET | Freq: Every day | ORAL | Status: DC
  Administered 2013-10-18 – 2013-10-20 (×3): 500 mg via ORAL
  Filled 2013-10-18 (×3): qty 1

## 2013-10-18 MED ORDER — POTASSIUM CHLORIDE CRYS ER 20 MEQ PO TBCR
40.0000 meq | EXTENDED_RELEASE_TABLET | Freq: Once | ORAL | Status: AC
  Administered 2013-10-18: 40 meq via ORAL
  Filled 2013-10-18: qty 2

## 2013-10-18 MED ORDER — WARFARIN SODIUM 2.5 MG PO TABS
12.5000 mg | ORAL_TABLET | Freq: Once | ORAL | Status: AC
  Administered 2013-10-18: 17:00:00 12.5 mg via ORAL
  Filled 2013-10-18: qty 1

## 2013-10-18 NOTE — Progress Notes (Addendum)
Patient ID: Dwayne Lawrence, male   DOB: 01-Feb-1956, 57 y.o.   MRN: [id number] [facility] PROGRESS NOTE  Aleks Nawrot WUJ:[id number] DOB: [birth year] DOA: 10/15/2013 PCP: No primary provider on file.  Brief narrative:  57 y.o. male with PMH of HTN, h/o PE, s/p radical prostatectomy (10/07/13) presented with SOB, fever, associated with pleuritic chest pain, some non productive cough; ED work up showed pneumonia and extensive bilateral PE; no nausea vomiting, diarrhea, no focal neuro symptoms.   Active Problems:  Acute hypoxic respiratory failure  - secondary to HCAP and bilateral PE  - management as noted below  HCAP (healthcare-associated pneumonia)  - pt clinically improving, no fever over 24 hours, WBC within normal limits this AM  - narrow ABX to Levaquin, d/c Vancomycin and Maxipime  - sputum analysis negative to date  - urine legionella and strep pneumo negative  Bilateral PE - with history of PE and was on Coumadin in the past  - pt has family history of PE as well  - continue Heparin with transition to Coumadin per pharmacy  S/P prostatectomy  - done 11/20, pt denies any problems from that perspective  HTN (hypertension)  - reasonable inpatient control  Anemia of chronic disease  - Hg overall stable  - no signs of active bleed  - CBC in AM  Hypokalemia - mild, will supplement and repeat BMP in AM  Consultants:  None Procedures/Studies:  Ct Angio Chest Pe W/cm &/or Wo Cm 10/15/2013 Extensive acute bilateral pulmonary embolism. Nonspecific bilateral lower lobe infiltrates, with differential diagnosis including pulmonary infarcts and pneumonia.  Dg Chest Port 1 View 10/15/2013 Mild bilateral basilar opacities are noted, with right worse than left, concerning for pneumonia or subsegmental atelectasis. Right perihilar opacity. Antibiotics:  Vancomycin 11/27 --> 11/30 Zosyn 11/27 --> 11/28  Maxipime 11/28 --> 11/30 Levaquin 11/30 -->  Code Status: Full  Family  Communication: Pt and wife at bedside  Disposition Plan: Home when medically stable  HPI/Subjective: No events overnight.   Objective: Filed Vitals:   10/17/13 0530 10/17/13 1349 10/17/13 2147 10/18/13 0501  BP: 125/60 127/76 108/64 123/70  Pulse: 76 77 86 64  Temp: 98.7 F (37.1 C) 98.6 F (37 C) 99.7 F (37.6 C) 98.8 F (37.1 C)  TempSrc: Oral Oral Oral Oral  Resp: 19 20 20 20   Height:      Weight:      SpO2: 96% 97% 93% 97%    Intake/Output Summary (Last 24 hours) at 10/18/13 0704 Last data filed at 10/18/13 0653  Gross per 24 hour  Intake 1883.37 ml  Output      0 ml  Net 1883.37 ml    Exam:   General:  Pt is alert, follows commands appropriately, not in acute distress  Cardiovascular: Regular rate and rhythm, S1/S2, no murmurs, no rubs, no gallops  Respiratory: Clear to auscultation bilaterally, no wheezing, no crackles, no rhonchi  Abdomen: Soft, non tender, non distended, bowel sounds present, no guarding  Extremities: No edema, pulses DP and PT palpable bilaterally  Neuro: Grossly nonfocal  Data Reviewed: Basic Metabolic Panel:  Recent Labs Lab 10/15/13 1852 10/16/13 0430 10/17/13 0445 10/18/13 0455  NA 135 138 138 137  K 3.7 3.5 3.4* 3.3*  CL 101 106 106 105  CO2 22 20 19 21   GLUCOSE 124* 117* 103* 107*  BUN 14 10 9 9   CREATININE 1.14 1.00 0.92 0.95  CALCIUM 9.1 8.5 8.5 8.5   CBC:  Recent Labs Lab 10/15/13 1852 10/16/13  0430 10/17/13 0445 10/18/13 0455  WBC 13.1* 9.8 9.6 9.2  HGB 9.7* 11.4* 11.6* 11.0*  HCT 28.7* 33.6* 34.1* 32.1*  MCV 88.9 88.2 87.7 87.5  PLT 147* 101* 122* 149*    Recent Results (from the past 240 hour(s))  URINE CULTURE     Status: None   Collection Time    10/15/13  7:13 PM      Result Value Range Status   Specimen Description URINE, CLEAN CATCH   Final   Special Requests Normal   Final   Culture  Setup Time     Final   Value: 10/16/2013 01:32     Performed at [facility] Count      Final   Value: NO GROWTH     Performed at [facility]   Culture     Final   Value: NO GROWTH     Performed at [facility]   Report Status 10/16/2013 FINAL   Final  CULTURE, BLOOD (ROUTINE X 2)     Status: None   Collection Time    10/15/13  8:00 PM      Result Value Range Status   Specimen Description BLOOD LEFT ANTECUBITAL   Final   Special Requests BOTTLES DRAWN AEROBIC AND ANAEROBIC 2.5ML   Final   Culture  Setup Time     Final   Value: 10/16/2013 01:32     Performed at [facility]   Culture     Final   Value:        BLOOD CULTURE RECEIVED NO GROWTH TO DATE CULTURE WILL BE HELD FOR 5 DAYS BEFORE ISSUING A FINAL NEGATIVE REPORT     Performed at [facility]   Report Status PENDING   Incomplete  CULTURE, BLOOD (ROUTINE X 2)     Status: None   Collection Time    10/15/13  9:35 PM      Result Value Range Status   Specimen Description BLOOD LEFT ANTECUBITAL   Final   Special Requests BOTTLES DRAWN AEROBIC AND ANAEROBIC 10CC   Final   Culture  Setup Time     Final   Value: 10/16/2013 01:33     Performed at [facility]   Culture     Final   Value:        BLOOD CULTURE RECEIVED NO GROWTH TO DATE CULTURE WILL BE HELD FOR 5 DAYS BEFORE ISSUING A FINAL NEGATIVE REPORT     Performed at [facility]   Report Status PENDING   Incomplete     Scheduled Meds: . amLODipine  5 mg Oral q morning - 10a  . ceFEPime (MAXIPIME) IV  1 g Intravenous Q8H  . docusate sodium  100 mg Oral BID  . sodium chloride  3 mL Intravenous Q12H  . vancomycin  1,000 mg Intravenous Q12H  . Warfarin - Pharmacist Dosing Inpatient   Does not apply q1800   Continuous Infusions: . heparin 2,100 Units/hr (10/18/13 0132)     [name], MD  [facility] Pager [phone]  If 7PM-7AM, please contact night-coverage [email or web address] Password [email or web address] 10/18/2013, 7:04 AM   LOS: 3 days

## 2013-10-18 NOTE — Progress Notes (Signed)
ANTICOAGULATION CONSULT NOTE - Follow Up Consult  Pharmacy Consult for Heparin/Warfarin  Indication: pulmonary embolus  No Known Allergies  Patient Measurements: Height: 6\' 1"  (185.4 cm) Weight: 238 lb 12.1 oz (108.3 kg) IBW/kg (Calculated) : 79.9   Vital Signs: Temp: 98.8 F (37.1 C) (11/30 0501) Temp src: Oral (11/30 0501) BP: 123/70 mmHg (11/30 0501) Pulse Rate: 64 (11/30 0501)  Labs:  Recent Labs  10/15/13 1852 10/15/13 2026 10/16/13 0430  10/17/13 0445 10/17/13 1301 10/17/13 2205 10/18/13 0455  HGB 9.7*  --  11.4*  --  11.6*  --   --  11.0*  HCT 28.7*  --  33.6*  --  34.1*  --   --  32.1*  PLT 147*  --  101*  --  122*  --   --  149*  APTT  --  30  --   --   --   --   --   --   LABPROT  --  15.3*  --   --  15.4*  --   --  15.3*  INR  --  1.24  --   --  1.25  --   --  1.24  HEPARINUNFRC  --   --  0.34  < > 0.27* 0.26* 0.41 0.43  CREATININE 1.14  --  1.00  --  0.92  --   --  0.95  < > = values in this interval not displayed.  Estimated Creatinine Clearance: 108.1 ml/min (by C-G formula based on Cr of 0.95).   Medications:  Scheduled:  . amLODipine  5 mg Oral q morning - 10a  . ceFEPime (MAXIPIME) IV  1 g Intravenous Q8H  . docusate sodium  100 mg Oral BID  . potassium chloride  40 mEq Oral Once  . sodium chloride  3 mL Intravenous Q12H  . vancomycin  1,000 mg Intravenous Q12H  . Warfarin - Pharmacist Dosing Inpatient   Does not apply q1800   Infusions:  . heparin 2,100 Units/hr (10/18/13 0132)   PRN: acetaminophen, HYDROcodone-acetaminophen, morphine injection, ondansetron (ZOFRAN) IV, ondansetron  Assessment: 49 yoM with PMH of HTN, PE in 2009, s/p radical prostatectomy 11/19 presents with SOB, fever, pleuritic chest pain, CT confirms PE. 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    Day # 3/5 Minimum overlap of warfarin/heparin for acute VTE Heparin level: 0.43, at goal on 2100  units/hr INR =  INR (1.24) remains at baseline, expected during initiation, will increase dose tonight CBC: H/H okay, Plt 144 around baseline, 149 today.  No major drug interactions with warfarin noted   Goal of Therapy:  INR 2-3 Monitor platelets by anticoagulation protocol: Yes Heparin level 0.3-0.7   Plan:   Warfarin 12.5 mg po x 1 tonight at 1800   Continue heparin to 2100 units/hr, f/u Daily Heparin Level     Continue Heparin/warfarin overlap for a minimum of 5 days and until INR is > 2 for at least 24 hours  Daily PT/INR/CBC  Monitor Renal function while on IV heparin, f/u plan for transition to lovenox?  Warfarin education was completed on 11/29, patient and wife demonstrated understanding.   Purity Irmen, Loma Messing PharmD Pager #: [phone] 7:11 AM 10/18/2013 '

## 2013-10-18 NOTE — Progress Notes (Signed)
ANTICOAGULATION CONSULT NOTE - Follow Up Consult  Pharmacy Consult for Heparin Indication: pulmonary embolus  No Known Allergies  Patient Measurements: Height: 6\' 1"  (185.4 cm) Weight: 238 lb 12.1 oz (108.3 kg) IBW/kg (Calculated) : 79.9 Heparin Dosing Weight:   Vital Signs: Temp: 99.7 F (37.6 C) (11/29 2147) Temp src: Oral (11/29 2147) BP: 108/64 mmHg (11/29 2147) Pulse Rate: 86 (11/29 2147)  Labs:  Recent Labs  10/15/13 1852 10/15/13 2026 10/16/13 0430  10/17/13 0445 10/17/13 1301 10/17/13 2205  HGB 9.7*  --  11.4*  --  11.6*  --   --   HCT 28.7*  --  33.6*  --  34.1*  --   --   PLT 147*  --  101*  --  122*  --   --   APTT  --  30  --   --   --   --   --   LABPROT  --  15.3*  --   --  15.4*  --   --   INR  --  1.24  --   --  1.25  --   --   HEPARINUNFRC  --   --  0.34  < > 0.27* 0.26* 0.41  CREATININE 1.14  --  1.00  --  0.92  --   --   < > = values in this interval not displayed.  Estimated Creatinine Clearance: 111.6 ml/min (by C-G formula based on Cr of 0.92).   Medications:  Infusions:  . heparin 2,100 Units/hr (10/17/13 1600)    Assessment: Patient with heparin level at goal.  No issues noted per RN.  Goal of Therapy:  Heparin level 0.3-0.7 units/ml Monitor platelets by anticoagulation protocol: Yes   Plan:  Continue heparin at current rate, recheck with am labs  Darlina Guys, Jacquenette Shone [place] 10/18/2013,12:57 AM

## 2013-10-19 LAB — BASIC METABOLIC PANEL
Calcium: 8.6 mg/dL (ref 8.4–10.5)
Chloride: 107 mEq/L (ref 96–112)
Creatinine, Ser: 0.86 mg/dL (ref 0.50–1.35)
GFR calc Af Amer: >90 mL/min (ref >90)
Potassium: 3.7 mEq/L (ref 3.5–5.1)
Sodium: 138 mEq/L (ref 135–145)

## 2013-10-19 LAB — STREP PNEUMONIAE URINARY ANTIGEN: Strep Pneumo Urinary Antigen: NEGATIVE

## 2013-10-19 LAB — CBC
HCT: 31.6 % — ABNORMAL LOW (ref 39.0–52.0)
Platelets: 180 10*3/uL (ref 150–400)
RBC: 3.64 MIL/uL — ABNORMAL LOW (ref 4.22–5.81)
RDW: 12.7 % (ref 11.5–15.5)
WBC: 9.1 10*3/uL (ref 4.0–10.5)

## 2013-10-19 LAB — HEPARIN LEVEL (UNFRACTIONATED): Heparin Unfractionated: 0.51 IU/mL (ref 0.30–0.70)

## 2013-10-19 LAB — PROTIME-INR
INR: 1.32 (ref 0.00–1.49)
Prothrombin Time: 16.1 seconds — ABNORMAL HIGH (ref 11.6–15.2)

## 2013-10-19 MED ORDER — WARFARIN SODIUM 7.5 MG PO TABS
15.0000 mg | ORAL_TABLET | Freq: Every day | ORAL | Status: AC
  Administered 2013-10-19: 15 mg via ORAL
  Filled 2013-10-19 (×2): qty 2

## 2013-10-19 MED ORDER — COLCHICINE 0.6 MG PO TABS
0.6000 mg | ORAL_TABLET | Freq: Two times a day (BID) | ORAL | Status: DC
  Administered 2013-10-19 – 2013-10-20 (×3): 0.6 mg via ORAL
  Filled 2013-10-19 (×4): qty 1

## 2013-10-19 NOTE — Progress Notes (Signed)
ANTICOAGULATION CONSULT NOTE - Follow Up Consult  Pharmacy Consult for Heparin/Warfarin  Indication: pulmonary embolus  No Known Allergies  Patient Measurements: Height: 6\' 1"  (185.4 cm) Weight: 238 lb 12.1 oz (108.3 kg) IBW/kg (Calculated) : 79.9   Vital Signs: Temp: 98.1 F (36.7 C) (12/01 0530) Temp src: Oral (12/01 0530) BP: 121/82 mmHg (12/01 0530) Pulse Rate: 58 (12/01 0530)  Labs:  Recent Labs  10/17/13 0445  10/17/13 2205 10/18/13 0455 10/19/13 0529  HGB 11.6*  --   --  11.0* 10.7*  HCT 34.1*  --   --  32.1* 31.6*  PLT 122*  --   --  149* 180  LABPROT 15.4*  --   --  15.3* 16.1*  INR 1.25  --   --  1.24 1.32  HEPARINUNFRC 0.27*  < > 0.41 0.43 0.51  CREATININE 0.92  --   --  0.95 0.86  < > = values in this interval not displayed.  Estimated Creatinine Clearance: 119.4 ml/min (by C-G formula based on Cr of 0.86).   Medications:  Scheduled:  . amLODipine  5 mg Oral q morning - 10a  . docusate sodium  100 mg Oral BID  . levofloxacin  500 mg Oral Daily  . sodium chloride  3 mL Intravenous Q12H  . Warfarin - Pharmacist Dosing Inpatient   Does not apply q1800   Infusions:  . 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   Day #4 warfarin/heparin bridge.   Heparin level therapeutic.  INR with very small increase to 1.32, subtherapeutic CBC: Hgb low, stable.  Plts ok.  No issues noted.  Pt also on levaquin PO which can cause elevations in INR   Goal of Therapy:  INR 2-3 Monitor platelets by anticoagulation protocol: Yes Heparin level 0.3-0.7   Plan:   Warfarin 15mg   PO x 1 at 1200 today  Continue heparin at 2100 units/hr, f/u Daily Heparin Level     Daily PT/INR/CBC  Monitor Renal function while on IV heparin, f/u plan for transition to lovenox  Warfarin education completed on 11/29  Haynes Hoehn, PharmD 10/19/2013, 8:38 AM  Pager: [phone]

## 2013-10-19 NOTE — Progress Notes (Signed)
Patient ID: Dwayne Lawrence, male   DOB: 19-Feb-1956, 57 y.o.   MRN: [id number] [facility] PROGRESS NOTE  Jakylan Ron [id number] DOB: August 15, 1956 DOA: 10/15/2013 PCP: No primary provider on file.  Brief narrative:  57 y.o. male with PMH of HTN, h/o PE, s/p radical prostatectomy (10/07/13) presented with SOB, fever, associated with pleuritic chest pain, some non productive cough; ED work up showed pneumonia and extensive bilateral PE; no nausea vomiting, diarrhea, no focal neuro symptoms.   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 Gout flare - give colchicine  Consultants:  None Procedures/Studies:  Ct Angio Chest Pe W/cm &/or Wo Cm 10/15/2013 Extensive acute bilateral pulmonary embolism. Nonspecific bilateral lower lobe infiltrates, with differential diagnosis including pulmonary infarcts and pneumonia.  Dg Chest Port 1 View 10/15/2013 Mild bilateral basilar opacities are noted, with right worse than left, concerning for pneumonia or subsegmental atelectasis. Right perihilar opacity. Antibiotics:  Vancomycin 11/27 --> 11/30  Zosyn 11/27 --> 11/28  Maxipime 11/28 --> 11/30   Levaquin 11/30 -->  Code Status: Full  Family Communication: Pt and wife at bedside  Disposition Plan: Home when medically stable  HPI/Subjective: No events overnight.   Objective: Filed Vitals:   10/18/13 1455 10/18/13 2005 10/19/13 0530 10/19/13 0933  BP: 117/79 122/62 121/82   Pulse: 73 74 58   Temp: 98.6 F (37 C) 99.3 F (37.4 C) 98.1 F (36.7 C)   TempSrc: Oral Oral Oral   Resp: 20 18 16    Height:      Weight:    109.544 kg (241 lb 8 oz)  SpO2: 93% 94% 98%     Intake/Output Summary (Last 24 hours) at 10/19/13 1155 Last data filed at 10/19/13 0630  Gross per 24 hour  Intake 975.95 ml  Output    900 ml  Net  75.95 ml    Exam:   General:  Pt is alert, follows commands appropriately, not in acute distress  Cardiovascular: Regular rate and rhythm, S1/S2, no murmurs, no rubs, no gallops  Respiratory: Clear to auscultation bilaterally, no wheezing, no crackles, no rhonchi  Abdomen: Soft, non tender, non distended, bowel sounds present, no guarding  Extremities: R foot swelling and tender to touch, pulses DP and PT palpable bilaterally  Neuro: Grossly nonfocal  Data Reviewed: Basic Metabolic Panel:  Recent Labs Lab 10/15/13 1852 10/16/13 0430 10/17/13 0445 10/18/13 0455 10/19/13 0529  NA 135 138 138 137 138  K 3.7 3.5 3.4* 3.3* 3.7  CL 101 106 106 105 107  CO2 22 20 19 21 21   GLUCOSE 124* 117* 103* 107* 102*  BUN 14 10  9 9 7   CREATININE 1.14 1.00 0.92 0.95 0.86  CALCIUM 9.1 8.5 8.5 8.5 8.6   CBC:  Recent Labs Lab 10/15/13 1852 10/16/13 0430 10/17/13 0445 10/18/13 0455 10/19/13 0529  WBC 13.1* 9.8 9.6 9.2 9.1  HGB 9.7* 11.4* 11.6* 11.0* 10.7*  HCT 28.7* 33.6* 34.1* 32.1* 31.6*  MCV 88.9 88.2 87.7 87.5 86.8  PLT 147* 101* 122* 149* 180    Recent Results (from the past 240 hour(s))  URINE CULTURE     Status: None   Collection Time    10/15/13  7:13 PM      Result Value Range Status   Specimen Description URINE, CLEAN CATCH   Final    Special Requests Normal   Final   Culture  Setup Time     Final   Value: 10/16/2013 01:32     Performed at [facility] Count     Final   Value: NO GROWTH     Performed at [facility]   Culture     Final   Value: NO GROWTH     Performed at [facility]   Report Status 10/16/2013 FINAL   Final  CULTURE, BLOOD (ROUTINE X 2)     Status: None   Collection Time    10/15/13  8:00 PM      Result Value Range Status   Specimen Description BLOOD LEFT ANTECUBITAL   Final   Special Requests BOTTLES DRAWN AEROBIC AND ANAEROBIC 2.5ML   Final   Culture  Setup Time     Final   Value: 10/16/2013 01:32     Performed at [facility]   Culture     Final   Value:        BLOOD CULTURE RECEIVED NO GROWTH TO DATE CULTURE WILL BE HELD FOR 5 DAYS BEFORE ISSUING A FINAL NEGATIVE REPORT     Performed at [facility]   Report Status PENDING   Incomplete  CULTURE, BLOOD (ROUTINE X 2)     Status: None   Collection Time    10/15/13  9:35 PM      Result Value Range Status   Specimen Description BLOOD LEFT ANTECUBITAL   Final   Special Requests BOTTLES DRAWN AEROBIC AND ANAEROBIC 10CC   Final   Culture  Setup Time     Final   Value: 10/16/2013 01:33     Performed at [facility]   Culture     Final   Value:        BLOOD CULTURE RECEIVED NO GROWTH TO DATE CULTURE WILL BE HELD FOR 5 DAYS BEFORE ISSUING A FINAL NEGATIVE REPORT     Performed at [facility]   Report Status PENDING   Incomplete     Scheduled Meds: . amLODipine  5 mg Oral q morning - 10a  . colchicine  0.6 mg Oral BID  . docusate sodium  100 mg Oral BID  . levofloxacin  500 mg Oral Daily  . sodium chloride  3 mL Intravenous Q12H  . warfarin  15 mg Oral Q1200  . Warfarin - Pharmacist Dosing Inpatient   Does not apply q1800   Continuous Infusions: . heparin 2,100 Units/hr (10/19/13 0233)   [name], MD  [facility] Pager [phone]  If 7PM-7AM, please contact  night-coverage [email or web address] Password [email or web address] 10/19/2013, 11:55 AM   LOS: 4 days

## 2013-10-20 ENCOUNTER — Inpatient Hospital Stay (HOSPITAL_COMMUNITY): Payer: 59

## 2013-10-20 LAB — LEGIONELLA ANTIGEN, URINE: Legionella Antigen, Urine: NEGATIVE

## 2013-10-20 LAB — BASIC METABOLIC PANEL
GFR calc Af Amer: >90 mL/min (ref >90)
GFR calc non Af Amer: >90 mL/min (ref >90)
Glucose, Bld: 104 mg/dL — ABNORMAL HIGH (ref 70–99)
Potassium: 3.7 mEq/L (ref 3.5–5.1)
Sodium: 137 mEq/L (ref 135–145)

## 2013-10-20 LAB — CBC
Hemoglobin: 11.2 g/dL — ABNORMAL LOW (ref 13.0–17.0)
MCHC: 33.8 g/dL (ref 30.0–36.0)
RBC: 3.8 MIL/uL — ABNORMAL LOW (ref 4.22–5.81)
RDW: 12.9 % (ref 11.5–15.5)

## 2013-10-20 LAB — PROTIME-INR
INR: 2.03 — ABNORMAL HIGH (ref 0.00–1.49)
Prothrombin Time: 22.3 seconds — ABNORMAL HIGH (ref 11.6–15.2)

## 2013-10-20 MED ORDER — LEVOFLOXACIN 500 MG PO TABS: 500.0000 mg | ORAL_TABLET | Freq: Every day | ORAL | Status: DC

## 2013-10-20 MED ORDER — HYDROCODONE-ACETAMINOPHEN 5-325 MG PO TABS: 1.0000 | ORAL_TABLET | Freq: Four times a day (QID) | ORAL | Status: DC | PRN

## 2013-10-20 MED ORDER — WARFARIN SODIUM 5 MG PO TABS: 5.0000 mg | ORAL_TABLET | Freq: Once | ORAL | Status: DC

## 2013-10-20 MED ORDER — COLCHICINE 0.6 MG PO TABS: 0.6000 mg | ORAL_TABLET | Freq: Two times a day (BID) | ORAL | Status: DC | PRN

## 2013-10-20 MED ORDER — POLYETHYLENE GLYCOL 3350 17 G PO PACK
17.0000 g | PACK | Freq: Every day | ORAL | Status: DC | PRN
  Administered 2013-10-20: 01:00:00 17 g via ORAL
  Filled 2013-10-20: qty 1

## 2013-10-20 MED ORDER — WARFARIN SODIUM 5 MG PO TABS
5.0000 mg | ORAL_TABLET | Freq: Once | ORAL | Status: DC
  Filled 2013-10-20: qty 1

## 2013-10-20 MED ORDER — BISACODYL 5 MG PO TBEC: 5.0000 mg | DELAYED_RELEASE_TABLET | Freq: Every day | ORAL | Status: DC | PRN

## 2013-10-20 MED ORDER — DOXYCYCLINE HYCLATE 100 MG PO TABS: 100.0000 mg | ORAL_TABLET | Freq: Two times a day (BID) | ORAL | Status: DC

## 2013-10-20 NOTE — Progress Notes (Signed)
[facility]  [facility] is providing the following services: RW  If patient discharges after hours, please call [phone].   Renard Hamper 10/20/2013, 2:39 PM

## 2013-10-20 NOTE — Progress Notes (Signed)
ANTICOAGULATION CONSULT NOTE - Follow Up Consult  Pharmacy Consult for Heparin/Warfarin  Indication: pulmonary embolus  No Known Allergies  Patient Measurements: Height: 6\' 1"  (185.4 cm) Weight: 241 lb 8 oz (109.544 kg) IBW/kg (Calculated) : 79.9   Vital Signs: Temp: 98.2 F (36.8 C) (12/02 0542) Temp src: Oral (12/02 0542) BP: 120/79 mmHg (12/02 0542) Pulse Rate: 64 (12/02 0542)  Labs:  Recent Labs  10/18/13 0455 10/19/13 0529 10/20/13 0517  HGB 11.0* 10.7* 11.2*  HCT 32.1* 31.6* 33.1*  PLT 149* 180 206  LABPROT 15.3* 16.1* 22.3*  INR 1.24 1.32 2.03*  HEPARINUNFRC 0.43 0.51 0.35  CREATININE 0.95 0.86 0.89    Estimated Creatinine Clearance: 115.9 ml/min (by C-G formula based on Cr of 0.89).   Medications:  Scheduled:  . amLODipine  5 mg Oral q morning - 10a  . colchicine  0.6 mg Oral BID  . docusate sodium  100 mg Oral BID  . levofloxacin  500 mg Oral Daily  . sodium chloride  3 mL Intravenous Q12H  . Warfarin - Pharmacist Dosing Inpatient   Does not apply q1800   Infusions:  . heparin 2,100 Units/hr (10/20/13 0539)   PRN: acetaminophen, bisacodyl, HYDROcodone-acetaminophen, morphine injection, neomycin-bacitracin-polymyxin, ondansetron (ZOFRAN) IV, ondansetron, polyethylene glycol  Assessment: 67 yoM with PMH of HTN, PE in 2009, s/p radical prostatectomy 11/19 presents with SOB, fever, pleuritic chest pain, CT confirms PE. 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   Day #5 warfarin/heparin bridge.   Heparin level therapeutic.  INR large bump from 1.32-->2.03.  CBC: Hgb low, stable.  Plts ok.  No issues noted.  Pt also on levaquin PO which can cause elevations in INR.  Will need to monitor INR closely now that it is therapeutic.   Goal of Therapy:  INR 2-3 Monitor platelets by anticoagulation protocol: Yes Heparin level 0.3-0.7   Plan:   Warfarin 5mg  po x 1 at 1800  Continue  heparin at 2100 units/hr, f/u daily heparin level.  If INR tx tomorrow, can d/c heparin.     Daily PT/INR/CBC  Monitor Renal function while on IV heparin  Warfarin education completed on 11/29  Haynes Hoehn, PharmD, BCPS 10/20/2013, 7:18 AM  Pager: [phone]

## 2013-10-20 NOTE — Evaluation (Addendum)
Physical Therapy Evaluation Patient Details Name: Dwayne Lawrence MRN: [id number] DOB: 1956/06/04 Today's Date: 10/20/2013 Time: 4098-1191 PT Time Calculation (min): 23 min  PT Assessment / Plan / Recommendation History of Present Illness  57 yo male admitted with pna, gout, PE, DVT.   Clinical Impression  PT eval to assess for assistive device. Practiced with both a walker and crutches to determine best device to use. Pt much safer with use of walker. Ambulation remains difficulty due to pt unable to tolerate any weightbearing on L LE and he fatigues fairly easily. Attempted to practice stair negotiation-pt unable to perform. Educated pt on, and briefly demonstrated, scooting up backwards on bottom. Pt prefers to scoot on bottom and states he will have his wife and son assist him. No follow up PT needs at discharge. Recommended pt use walker for ambulation. Notified case manager of need for walker.    PT Assessment  Patent does not need any further PT services    Follow Up Recommendations  No PT follow up    Does the patient have the potential to tolerate intense rehabilitation      Barriers to Discharge        Equipment Recommendations  Rolling walker with 5" wheels    Recommendations for Other Services     Frequency      Precautions / Restrictions Precautions Precautions: Fall Restrictions Weight Bearing Restrictions: No   Pertinent Vitals/Pain 10/10 L LE with activity.       Mobility  Bed Mobility Bed Mobility: Supine to Sit Supine to Sit: 7: Independent Transfers Transfers: Sit to Stand;Stand to Sit Sit to Stand: 5: Supervision;From chair/3-in-1 Stand to Sit: 5: Supervision;To chair/3-in-1 Details for Transfer Assistance: VCs safety, hand placement Ambulation/Gait Ambulation/Gait Assistance: 4: Min guard;4: Min assist Ambulation Distance (Feet): 50 Feet (x2) Assistive device: Rolling walker;Crutches Ambulation/Gait Assistance Details: min assist for ambulation  with crutches, min guard for ambulation with walker. Explained to pt that walker use is safest/best mode of ambulating. Pt still unable to put any weight throught L LE-not even TTWB. So pt uses NWB technique due to pain. Multiple seated rest breaks needed during session due to pt's fatigue.  Gait Pattern: Step-to pattern;Antalgic Stairs: Yes Stairs Assistance Details (indicate cue type and reason): Attempted but pt was unable.     Exercises     PT Diagnosis:    PT Problem List:   PT Treatment Interventions:       PT Goals(Current goals can be found in the care plan section) Acute Rehab PT Goals PT Goal Formulation: No goals set, d/c therapy  Visit Information  Last PT Received On: 10/20/13 Assistance Needed: +1 History of Present Illness: 57 yo male admitted with pna, gout, PE, DVT.        Prior Functioning  Home Living Family/patient expects to be discharged to:: Private residence Living Arrangements: Spouse/significant other Available Help at Discharge: Family Type of Home: House Home Access: Stairs to enter Secretary/administrator of Steps: 4 Entrance Stairs-Rails: None Home Layout: One level Home Equipment: None Prior Function Level of Independence: Independent Communication Communication: No difficulties    Cognition  Cognition Arousal/Alertness: Awake/alert Behavior During Therapy: WFL for tasks assessed/performed Overall Cognitive Status: Within Functional Limits for tasks assessed    Extremity/Trunk Assessment Upper Extremity Assessment Upper Extremity Assessment: Generalized weakness Lower Extremity Assessment Lower Extremity Assessment: Generalized weakness Cervical / Trunk Assessment Cervical / Trunk Assessment: Normal   Balance    End of Session PT - End of Session Equipment  Utilized During Treatment: Gait belt Activity Tolerance: Patient limited by pain;Patient limited by fatigue Patient left: in chair;with call bell/phone within reach;with  family/visitor present  GP     Rebeca Alert, MPT Pager: [phone]

## 2013-10-20 NOTE — Discharge Summary (Signed)
Physician Discharge Summary  Dwayne Lawrence WJX:[id number] DOB: Jan 02, 1956 DOA: 10/15/2013  PCP: No primary provider on file.  Admit date: 10/15/2013 Discharge date: 10/20/2013  Recommendations for Outpatient Follow-up:  1. Pt will need to follow up with PCP in 2-3 weeks post discharge 2. Please obtain BMP to evaluate electrolytes and kidney function 3. Please also check CBC to evaluate Hg and Hct levels 4. Please note that patient was discharged on Coumadin and is advised to check his PT/INR level tomorrow morning so the Coumadin dose can be readjusted appropriately 5. Please also note that patient was discharged on doxycycline to complete therapy for left foot cellulitis  Discharge Diagnoses: Hospital-acquired pneumonia, pulmonary embolus Active Problems:   HCAP (healthcare-associated pneumonia)   S/P prostatectomy   SOB (shortness of breath)   HTN (hypertension)  Discharge Condition: Stable  Diet recommendation: Heart healthy diet discussed in details   Brief narrative:  57 y.o. male with PMH of HTN, h/o PE, s/p radical prostatectomy (10/07/13) presented with SOB, fever, associated with pleuritic chest pain, some non productive cough; ED work up showed pneumonia and extensive bilateral PE; no nausea vomiting, diarrhea, no focal neuro symptoms.   Active Problems:  Acute hypoxic respiratory failure  - secondary to HCAP and bilateral PE  - PT/INR is at target range and will therefore start heparin with continuation of Coumadin - Patient advised to see his primary care physician in the morning to have PT/INR checked for that the level of Coumadin can be adjusted appropriately HCAP (healthcare-associated pneumonia)  - pt clinically improving, no fever over 24 hours, WBC within normal limits this AM  - continue Levaquin for 2 more days upon discharge to complete therapy for hospital-acquired pneumonia - sputum analysis negative to date  - urine legionella and strep pneumo negative   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 Gout flare versus cellulitis - Patient will be started on doxycycline to complete therapy upon discharge  Consultants:  None Procedures/Studies:  Ct Angio Chest Pe W/cm &/or Wo Cm 10/15/2013 Extensive acute bilateral pulmonary embolism. Nonspecific bilateral lower lobe infiltrates, with differential diagnosis including pulmonary infarcts and pneumonia.  Dg Chest Port 1 View 10/15/2013 Mild bilateral basilar opacities are noted, with right worse than left, concerning for pneumonia or subsegmental atelectasis. Right perihilar opacity. Antibiotics:  Vancomycin 11/27 --> 11/30  Zosyn 11/27 --> 11/28  Maxipime 11/28 --> 11/30  Levaquin 11/30 --> Doxycycline   Code Status: Full  Family Communication: Pt and wife at bedside   Discharge Exam: Filed Vitals:   10/20/13 0735  BP: 142/77  Pulse: 68  Temp: 97.5 F (36.4 C)  Resp: 16   Filed Vitals:   10/19/13 1407 10/19/13 2143 10/20/13 0542 10/20/13 0735  BP: 112/69 104/63 120/79 142/77  Pulse: 73 69 64 68  Temp: 98.4 F (36.9 C) 98 F (36.7 C) 98.2 F (36.8 C) 97.5 F (36.4 C)  TempSrc: Oral Oral Oral Oral  Resp: 18 16 16 16   Height:  Weight:      SpO2: 95% 94% 98% 95%    General: Pt is alert, follows commands appropriately, not in acute distress Cardiovascular: Regular rate and rhythm, S1/S2 +, no murmurs, no rubs, no gallops Respiratory: Clear to auscultation bilaterally, no wheezing, no crackles, no rhonchi Abdominal: Soft, non tender, non distended,  bowel sounds +, no guarding Extremities: no edema, no cyanosis, pulses palpable bilaterally DP and PT Neuro: Grossly nonfocal  Discharge Instructions  Discharge Orders   Future Orders Complete By Expires   Diet - low sodium heart healthy  As directed    Increase activity slowly  As directed        Medication List    STOP taking these medications       ciprofloxacin 500 MG tablet  Commonly known as:  CIPRO      TAKE these medications       acetaminophen 500 MG tablet  Commonly known as:  TYLENOL  Take 500 mg by mouth every 6 (six) hours as needed.     amLODipine 5 MG tablet  Commonly known as:  NORVASC  Take 5 mg by mouth every morning.     colchicine 0.6 MG tablet  Take 1 tablet (0.6 mg total) by mouth 2 (two) times daily as needed (gout).     docusate sodium 100 MG capsule  Commonly known as:  COLACE  Take 100 mg by mouth 2 (two) times daily.     doxycycline 100 MG tablet  Commonly known as:  VIBRA-TABS  Take 1 tablet (100 mg total) by mouth 2 (two) times daily.     HYDROcodone-acetaminophen 5-325 MG per tablet  Commonly known as:  NORCO  Take 1-2 tablets by mouth every 6 (six) hours as needed.     levofloxacin 500 MG tablet  Commonly known as:  LEVAQUIN  Take 1 tablet (500 mg total) by mouth daily.     magnesium citrate Soln  Take 1 Bottle by mouth once.     warfarin 5 MG tablet  Commonly known as:  COUMADIN  Take 1 tablet (5 mg total) by mouth one time only at 6 PM.           Follow-up Information   Follow up with [name], MD. (As needed if symptoms worsen, [phone])    Specialty:  Internal Medicine   Contact information:   [address] [place] [address] [phone]        The results of significant diagnostics from this hospitalization (including imaging, microbiology, ancillary and laboratory) are listed below for reference.     Microbiology: Recent Results (from the past 240 hour(s))  URINE CULTURE      Status: None   Collection Time    10/15/13  7:13 PM      Result Value Range Status   Specimen Description URINE, CLEAN CATCH   Final   Special Requests Normal   Final   Culture  Setup Time     Final   Value: 10/16/2013 01:32     Performed at [facility] Count     Final   Value: NO GROWTH     Performed at [facility]   Culture     Final   Value: NO GROWTH     Performed at [facility]   Report Status 10/16/2013 FINAL   Final  CULTURE, BLOOD (ROUTINE X 2)     Status: None   Collection Time    10/15/13  8:00 PM      Result Value Range Status   Specimen Description BLOOD LEFT ANTECUBITAL   Final   Special Requests BOTTLES DRAWN AEROBIC AND ANAEROBIC 2.5ML   Final   Culture  Setup Time     Final   Value: 10/16/2013 01:32     Performed at [facility]   Culture     Final   Value:        BLOOD CULTURE RECEIVED NO GROWTH TO DATE CULTURE WILL BE HELD FOR 5 DAYS BEFORE ISSUING A FINAL NEGATIVE REPORT     Performed at [facility]   Report Status PENDING   Incomplete  CULTURE, BLOOD (ROUTINE X 2)     Status: None   Collection Time    10/15/13  9:35 PM      Result Value Range Status   Specimen Description BLOOD LEFT ANTECUBITAL   Final   Special Requests BOTTLES DRAWN AEROBIC AND ANAEROBIC 10CC   Final   Culture  Setup Time     Final   Value: 10/16/2013 01:33     Performed at [facility]   Culture     Final   Value:        BLOOD CULTURE RECEIVED NO GROWTH TO DATE CULTURE WILL BE HELD FOR 5 DAYS BEFORE ISSUING A FINAL NEGATIVE REPORT     Performed at [facility]   Report Status PENDING   Incomplete     Labs: Basic Metabolic Panel:  Recent Labs Lab 10/16/13 0430 10/17/13 0445 10/18/13 0455 10/19/13 0529 10/20/13 0517  NA 138 138 137 138 137  K 3.5 3.4* 3.3* 3.7 3.7  CL 106 106 105 107 105  CO2 20 19 21 21 23   GLUCOSE 117* 103* 107* 102* 104*  BUN 10 9 9 7 7   CREATININE 1.00 0.92 0.95 0.86 0.89  CALCIUM  8.5 8.5 8.5 8.6 8.6   CBC:  Recent Labs Lab 10/16/13 0430 10/17/13 0445 10/18/13 0455 10/19/13 0529 10/20/13 0517  WBC 9.8 9.6 9.2 9.1 8.6  HGB 11.4* 11.6* 11.0* 10.7* 11.2*  HCT 33.6* 34.1* 32.1* 31.6* 33.1*  MCV 88.2 87.7 87.5 86.8 87.1  PLT 101* 122* 149* 180 206    SIGNED: Time coordinating discharge: Over 30 minutes  [name], MD  [facility] 10/20/2013, 10:52 AM Pager [phone]  If 7PM-7AM, please contact night-coverage [email or web address] Password [email or web address]

## 2013-10-22 LAB — CULTURE, BLOOD (ROUTINE X 2): Culture: NO GROWTH

## 2014-06-19 IMAGING — CT CT ANGIO CHEST
1 of 2 series · 19 of 32 positions shown · IV contrast (OMNIPAQUE 300)
Comparison: 08/05/2008

CLINICAL DATA: Right-sided chest pain. Shortness of breath.
Previous history pulmonary embolism.

EXAM:
CT ANGIOGRAPHY CHEST WITH CONTRAST
TECHNIQUE: Multidetector CT imaging of the chest was performed using the
standard protocol during bolus administration of intravenous
contrast. Multiplanar CT image reconstructions including MIPs were
obtained to evaluate the vascular anatomy.

[Series 6: thins for pacs · axial · 0.83mm/px · z∈[+1414,+1655]mm · 19 of 269 slices shown]
[im 14/269  lung]
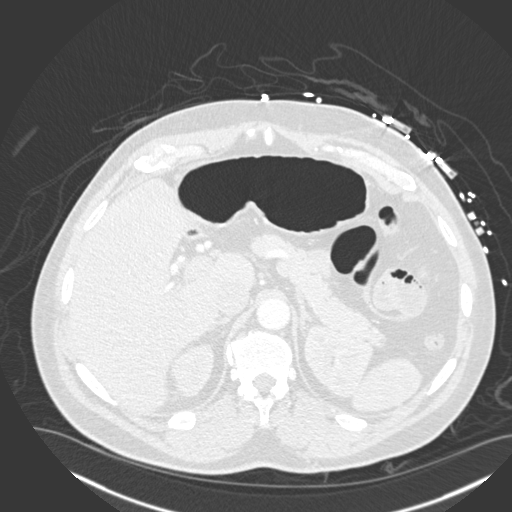
[im 27/269  mediastinal]
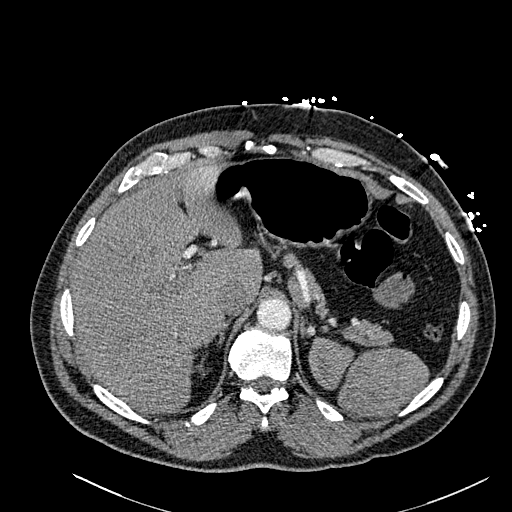
[im 41/269  lung]
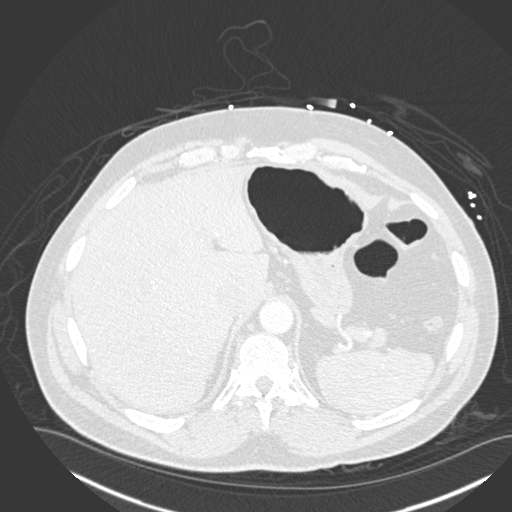
[im 68/269  mediastinal]
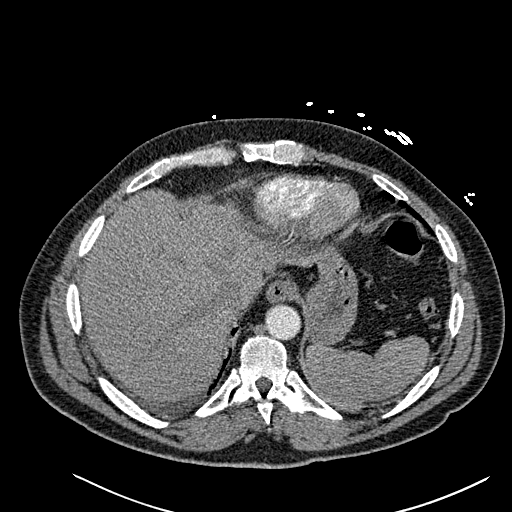
[im 81/269  lung]
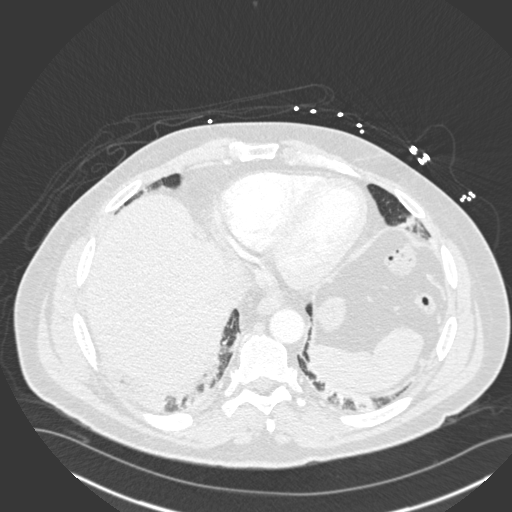
[im 90/269  mediastinal]
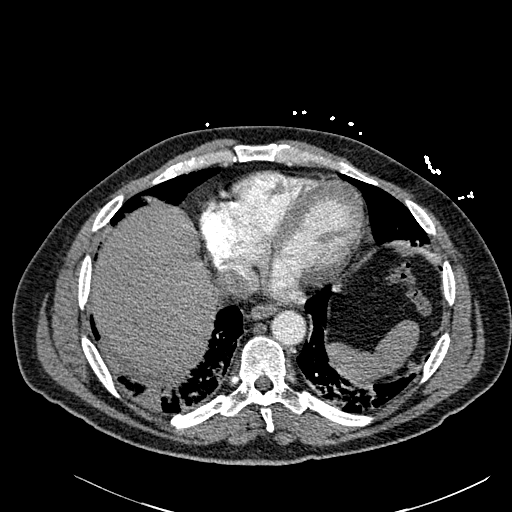
[im 94/269  lung]
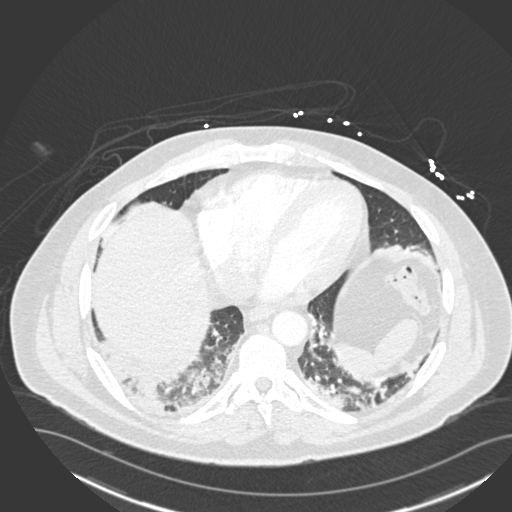
[im 108/269  mediastinal]
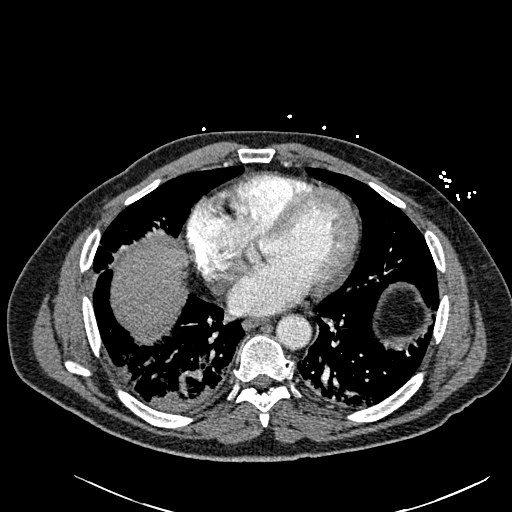
[im 121/269  lung]
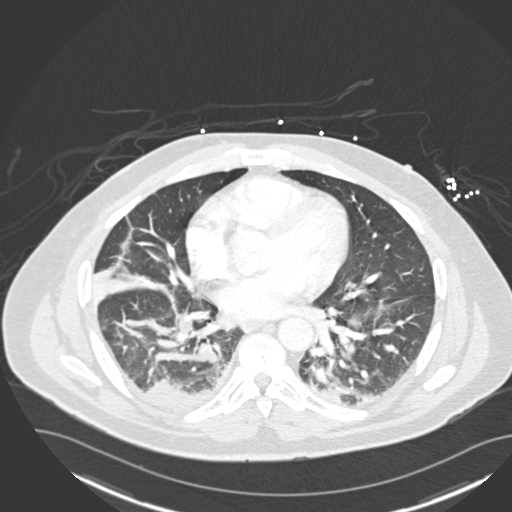
[im 135/269  mediastinal]
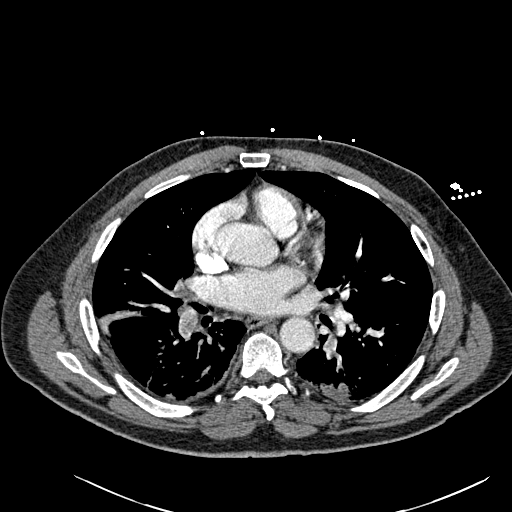
[im 148/269  lung]
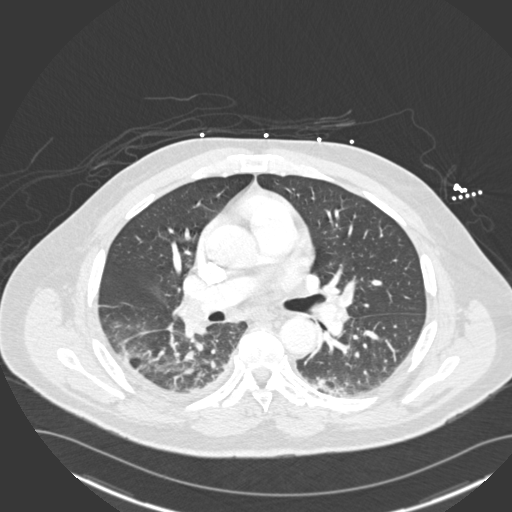
[im 161/269  mediastinal]
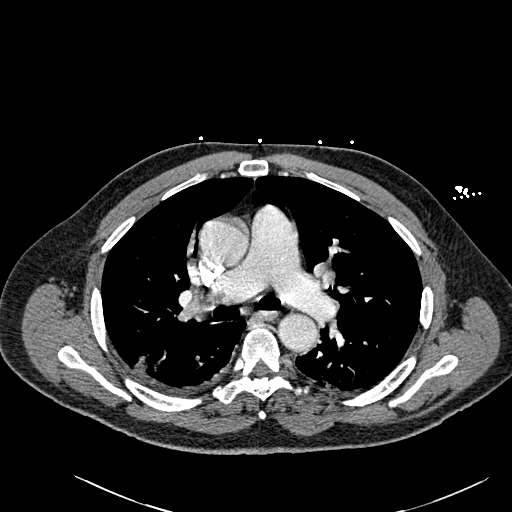
[im 175/269  lung]
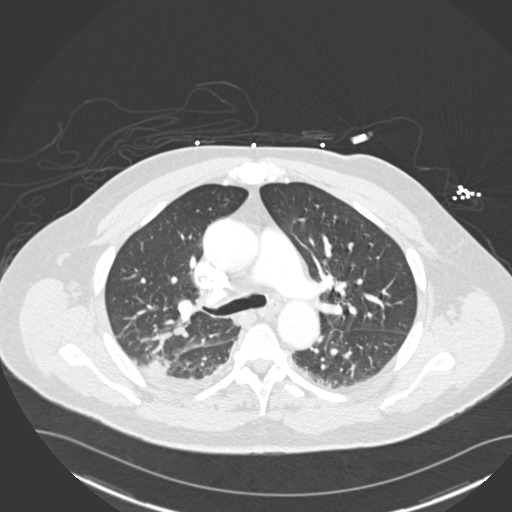
[im 179/269  mediastinal]
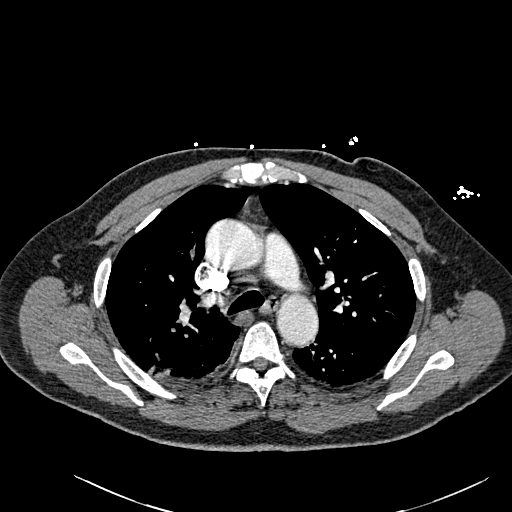
[im 188/269  lung]
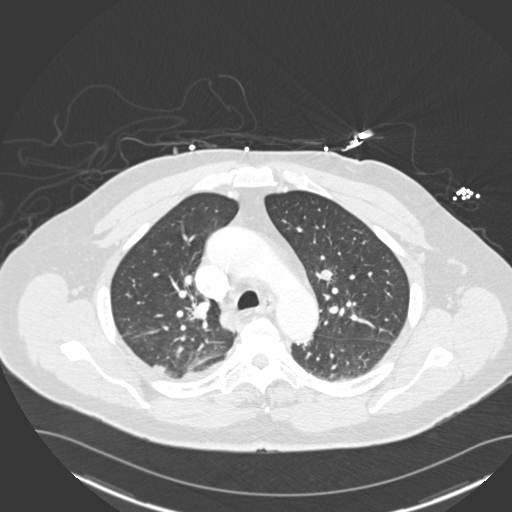
[im 202/269  mediastinal]
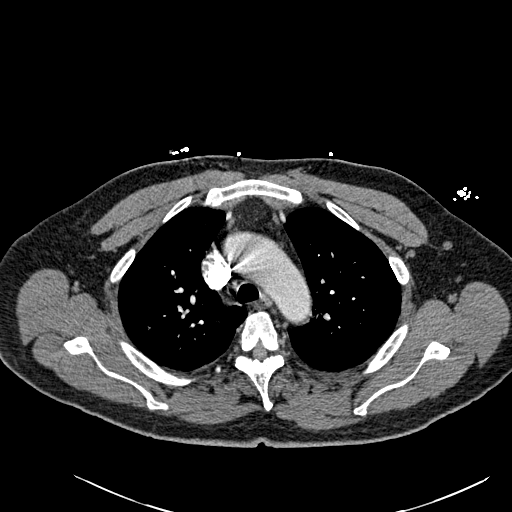
[im 228/269  lung]
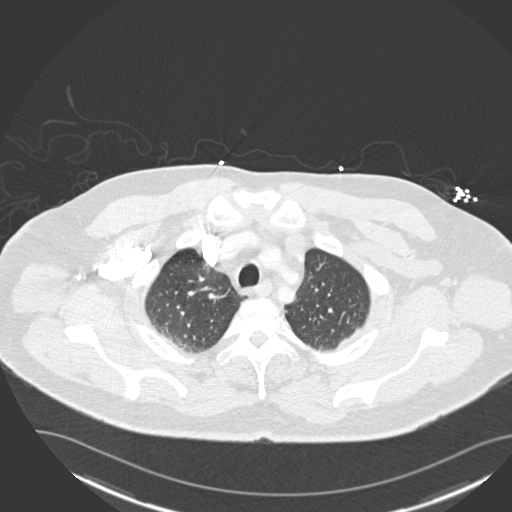
[im 242/269  mediastinal]
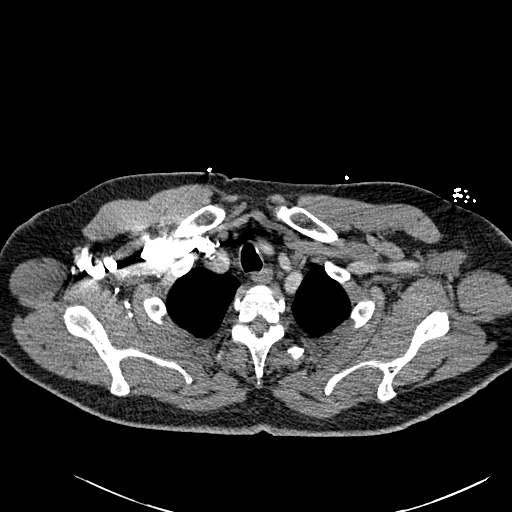
[im 255/269  lung]
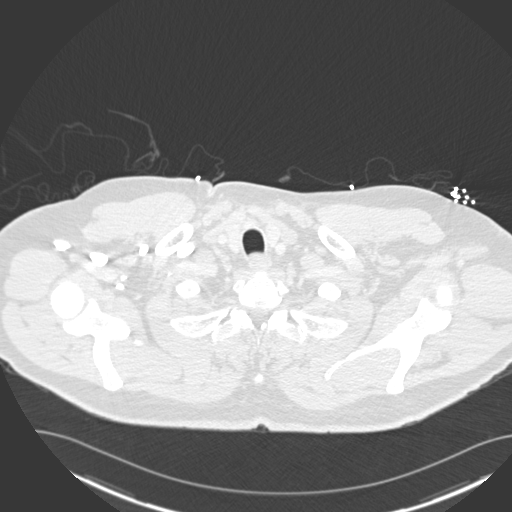

[19 of 32 positions shown; findings below may reference images not displayed]

FINDINGS: Extensive acute pulmonary embolism is seen in the the distal right
pulmonary artery as well as the central lobar pulmonary arteries
bilaterally. No evidence of thoracic aortic aneurysm or dissection.

Bilateral lower lobe infiltrates are seen, which are nonspecific,
and differential diagnosis includes but pulmonary infarcts and
pneumonia. No evidence of pleural or pericardial effusion. No masses
or lymphadenopathy identified.

Review of the MIP images confirms the above findings.
IMPRESSION: Extensive acute bilateral pulmonary embolism.

Nonspecific bilateral lower lobe infiltrates, with differential
diagnosis including pulmonary infarcts and pneumonia.

Critical Value/emergent results were called by telephone at the time
of interpretation on 10/15/2013 at [DATE] to Dr.Spandana Tavakoli, who
verbally acknowledged these results.

CONTRAST:L:
CONTRAST:L
100mL OMNIPAQUE IOHEXOL 350 MG/ML SOLN

## 2020-01-26 ENCOUNTER — Ambulatory Visit: Payer: Self-pay | Admitting: Orthopaedic Surgery

## 2020-01-27 ENCOUNTER — Ambulatory Visit: Payer: Self-pay | Admitting: Orthopaedic Surgery

## 2020-02-29 ENCOUNTER — Ambulatory Visit (INDEPENDENT_AMBULATORY_CARE_PROVIDER_SITE_OTHER): Payer: Medicare HMO

## 2020-02-29 ENCOUNTER — Other Ambulatory Visit: Payer: Self-pay

## 2020-02-29 ENCOUNTER — Ambulatory Visit: Payer: Medicare HMO | Admitting: Orthopaedic Surgery

## 2020-02-29 VITALS — Ht 73.0 in | Wt 280.0 lb

## 2020-02-29 DIAGNOSIS — M25552 Pain in left hip: Secondary | ICD-10-CM | POA: Diagnosis not present

## 2020-02-29 DIAGNOSIS — M1612 Unilateral primary osteoarthritis, left hip: Secondary | ICD-10-CM | POA: Diagnosis not present

## 2020-02-29 NOTE — Progress Notes (Signed)
Office Visit Note   Patient: Dwayne Lawrence           Date of Birth: 16-Mar-1956           MRN: [id number] Visit Date: 02/29/2020              Requested by: No referring provider defined for this encounter. PCP: [name]., MD   Assessment & Plan: Visit Diagnoses:  1. Pain in left hip   2. Unilateral primary osteoarthritis, left hip     Plan: At this point he is exhausted all treatment options.  His pain is severe and daily and his osteoarthritis is severe of his left hip.  I talked in detail in length about hip replacement surgery.  We discussed his interoperative and postoperative course.  We talked about the risk and benefits of surgery.  He does have copies of his x-rays as well and I gave him a handout about hip replacement surgery.  I do feel at this point this is warranted based on his clinical findings and radiographic findings and the failure of conservative treatment for over a year.  He will have to be off the Coumadin for about 5 days with a bridge with Lovenox before surgery as well.  Once we have a surgical date we can address Lovenox dosing and timing of stopping Coumadin.  All questions and concerns were answered and addressed.  Follow-Up Instructions: Return for 2 weeks post-op.   Orders:  Orders Placed This Encounter  Procedures  . XR HIP UNILAT W OR W/O PELVIS 1V LEFT   No orders of the defined types were placed in this encounter.     Procedures: No procedures performed   Clinical Data: No additional findings.   Subjective: Chief Complaint  Patient presents with  . Left Hip - Pain  The patient is a 64 year old gentleman seen for the first time today.  He comes in for evaluation treatment of debilitating left hip pain is been going on for many years now.  He has been told before by other physicians that he has osteoarthritis of his left hip.  He does report groin pain.  He reports severe pain with pivoting the left hip.  He says if he has been  sitting for a while or riding in a car for a while and gets up to walk he has severe left hip and groin pain.  This is caused him to walk with a limp.  It is at this point 10 out of 10 in terms of his pain.  It is detrimentally affecting his actives daily living, his quality of life and his mobility on a daily basis at this standpoint.  He is worked on weight loss and activity modification.  He cannot take anti-inflammatories because he is on Coumadin for chronic blood clots.  If he does have surgery will have to be bridged with Lovenox for several days before surgery.  He denies any acute change in medical status.  HPI  Review of Systems He currently denies any headache, chest pain, shortness of breath, fever, chills, nausea, vomiting  Objective: Vital Signs: Ht 6\' 1"  (1.854 m)   Wt 280 lb (127 kg)   BMI 36.94 kg/m   Physical Exam He is alert and oriented x3 and in no acute distress Ortho Exam Examination of his left hip shows severe stiffness with internal and external rotation as well as significant pain in the groin.  His right hip moves more fluidly.  He is  slightly shorter on the left lower extremity in the right side. Specialty Comments:  No specialty comments available.  Imaging: XR HIP UNILAT W OR W/O PELVIS 1V LEFT  Result Date: 02/29/2020 A low AP pelvis and lateral of the left hip shows severe end-stage arthritis of the left hip.  There is cystic changes in the acetabulum and femoral head.  There is complete loss of superior lateral joint space.  There is flattening of the femoral head and periarticular osteophytes.    PMFS History: Patient Active Problem List   Diagnosis Date Noted  . Unilateral primary osteoarthritis, left hip 02/29/2020  . HCAP (healthcare-associated pneumonia) 10/15/2013  . S/P prostatectomy 10/15/2013  . SOB (shortness of breath) 10/15/2013  . HTN (hypertension) 10/15/2013   Past Medical History:  Diagnosis Date  . Gout no recent flares  .  Hypertension   . Pulmonary embolus 2009   right lung  . Sleep apnea    does not use cpap, last sleep study more than 3 yrs ago    No family history on file.  Past Surgical History:  Procedure Laterality Date  . LYMPHADENECTOMY Bilateral 10/07/2013   Procedure: LYMPHADENECTOMY " BILATERAL PELVIC LYMPH NODE DISSECTION";  Surgeon: Bernestine Amass, MD;  Location: [facility];  Service: Urology;  Laterality: Bilateral;  . PROSTATE BIOPSY  2013 and oct 2014  . ROBOT ASSISTED LAPAROSCOPIC RADICAL PROSTATECTOMY N/A 10/07/2013   Procedure: ROBOTIC ASSISTED LAPAROSCOPIC RADICAL PROSTATECTOMY;  Surgeon: Bernestine Amass, MD;  Location: [facility];  Service: Urology;  Laterality: N/A;   Social History   Occupational History  . Not on file  Tobacco Use  . Smoking status: Former Smoker    Types: Pipe    Quit date: 11/19/1997    Years since quitting: 22.2  . Smokeless tobacco: Former Systems developer    Types: [place] date: 11/19/1997  Substance and Sexual Activity  . Alcohol use: Yes    Comment: 2 drinks per day  . Drug use: No  . Sexual activity: Not on file

## 2020-03-10 ENCOUNTER — Telehealth: Payer: Self-pay

## 2020-03-10 ENCOUNTER — Other Ambulatory Visit: Payer: Self-pay

## 2020-03-10 NOTE — Telephone Encounter (Signed)
Patient aware of the below message and he will call his PCP

## 2020-03-10 NOTE — Telephone Encounter (Signed)
Patient is scheduled for left THA on 03/22/20.  Dr. Ninfa Linden stated in his note that he would need a Lovenox bridge and to be off of Coumadin.  Will Dr. Ninfa Linden order the Lovenox?  Please advise patient.

## 2020-03-10 NOTE — Telephone Encounter (Signed)
Can he check with who provides his blood thinning meds to find out if they will dose his Lovenox.  He would nee to stop Coumadin 5 days prior to his hip replacement and have Lovenox injections for 4 days.  Will need to find out what dose they recommend.

## 2020-03-10 NOTE — Telephone Encounter (Signed)
Please advise 

## 2020-03-17 NOTE — Progress Notes (Signed)
[id number] [address], [place] [address] Phone: [phone] Fax: [phone]      Your procedure is scheduled on May 4  Report to [facility] Main Entrance "A" at 1200 P.M., and check in at the Admitting office.  Call this number if you have problems the morning of surgery:  [phone]  Call [phone] if you have any questions prior to your surgery date Monday-Friday 8am-4pm    Remember:  Do not eat or drink after midnight the night before your surgery     Take these medicines the morning of surgery with A SIP OF WATER  allopurinol (ZYLOPRIM) amLODipine (NORVASC)  Follow your surgeon's instructions on when to stop warfarin (COUMADIN).  If no instructions were given by your surgeon then you will need to call the office to get those instructions.     As of today, STOP taking any Aspirin (unless otherwise instructed by your surgeon) and Aspirin containing products, Aleve, Naproxen, Ibuprofen, Motrin, Advil, Goody's, BC's, all herbal medications, fish oil, and all vitamins.                     Do not wear jewelry            Do not wear lotions, powders, colognes, or deodorant.             Men may shave face and neck.            Do not bring valuables to the hospital.            [facility] is not responsible for any belongings or valuables.  Do NOT Smoke (Tobacco/Vapping) or drink Alcohol 24 hours prior to your procedure If you use a CPAP at night, you may bring all equipment for your overnight stay.   Contacts, glasses, dentures or bridgework may not be worn into surgery.      For patients admitted to the hospital, discharge time will be determined by your treatment team.   Patients discharged the day of surgery will not be allowed to drive home, and someone needs to stay with them for 24 hours.    Special instructions:   [facility]- Preparing For Surgery  Before surgery, you can play an important role.  Because skin is not sterile, your skin needs to be as free of germs as possible. You can reduce the number of germs on your skin by washing with CHG (chlorahexidine gluconate) Soap before surgery.  CHG is an antiseptic cleaner which kills germs and bonds with the skin to continue killing germs even after washing.    Oral Hygiene is also important to reduce your risk of infection.  Remember - BRUSH YOUR TEETH THE MORNING OF SURGERY WITH YOUR REGULAR TOOTHPASTE  Please do not use if you have an allergy to CHG or antibacterial soaps. If your skin becomes reddened/irritated stop using the CHG.  Do not shave (including legs and underarms) for at least 48 hours prior to first CHG shower. It is OK to shave your face.  Please follow these instructions carefully.   1. Shower the NIGHT BEFORE SURGERY and the MORNING OF SURGERY with CHG Soap.   2. If you chose to wash your hair, wash your hair first as usual with your normal shampoo.  3. After you shampoo, rinse your hair and body thoroughly to remove the shampoo.  4. Use CHG as you would any other liquid soap. You can apply  CHG directly to the skin and wash gently with a scrungie or a clean washcloth.   5. Apply the CHG Soap to your body ONLY FROM THE NECK DOWN.  Do not use on open wounds or open sores. Avoid contact with your eyes, ears, mouth and genitals (private parts). Wash Face and genitals (private parts)  with your normal soap.   6. Wash thoroughly, paying special attention to the area where your surgery will be performed.  7. Thoroughly rinse your body with warm water from the neck down.  8. DO NOT shower/wash with your normal soap after using and rinsing off the CHG Soap.  9. Pat yourself dry with a CLEAN TOWEL.  10. Wear CLEAN PAJAMAS to bed the night before surgery, wear comfortable clothes the morning of surgery  11. Place CLEAN SHEETS on your bed the night of your first shower and DO NOT SLEEP WITH PETS.   Day of Surgery:   Do  not apply any deodorants/lotions.  Please wear clean clothes to the hospital/surgery center.   Remember to brush your teeth WITH YOUR REGULAR TOOTHPASTE.   Please read over the following fact sheets that you were given.

## 2020-03-18 ENCOUNTER — Encounter (HOSPITAL_COMMUNITY)
Admission: RE | Admit: 2020-03-18 | Discharge: 2020-03-18 | Disposition: A | Discharge status: Home / Self Care | Payer: Medicare HMO | Source: Ambulatory Visit | Attending: Orthopaedic Surgery | Admitting: Orthopaedic Surgery

## 2020-03-18 ENCOUNTER — Other Ambulatory Visit (HOSPITAL_COMMUNITY)
Admission: RE | Admit: 2020-03-18 | Discharge: 2020-03-18 | Disposition: A | Discharge status: Home / Self Care | Payer: Medicare HMO | Source: Ambulatory Visit | Attending: Orthopaedic Surgery | Admitting: Orthopaedic Surgery

## 2020-03-18 ENCOUNTER — Encounter (HOSPITAL_COMMUNITY): Payer: Self-pay

## 2020-03-18 ENCOUNTER — Other Ambulatory Visit: Payer: Self-pay

## 2020-03-18 DIAGNOSIS — Z20822 Contact with and (suspected) exposure to covid-19: Secondary | ICD-10-CM | POA: Insufficient documentation

## 2020-03-18 DIAGNOSIS — Z01812 Encounter for preprocedural laboratory examination: Secondary | ICD-10-CM | POA: Insufficient documentation

## 2020-03-18 DIAGNOSIS — Z01810 Encounter for preprocedural cardiovascular examination: Secondary | ICD-10-CM | POA: Insufficient documentation

## 2020-03-18 HISTORY — DX: Unspecified osteoarthritis, unspecified site: M19.90

## 2020-03-18 HISTORY — DX: Malignant neoplasm of prostate: C61

## 2020-03-18 HISTORY — DX: Acute embolism and thrombosis of unspecified deep veins of unspecified lower extremity: I82.409

## 2020-03-18 LAB — CBC
HCT: 44.6 % (ref 39.0–52.0)
Hemoglobin: 14.1 g/dL (ref 13.0–17.0)
MCH: 28.8 pg (ref 26.0–34.0)
MCHC: 31.6 g/dL (ref 30.0–36.0)
MCV: 91 fL (ref 80.0–100.0)
Platelets: 134 10*3/uL — ABNORMAL LOW (ref 150–400)
RBC: 4.9 MIL/uL (ref 4.22–5.81)
RDW: 13.6 % (ref 11.5–15.5)
WBC: 7 10*3/uL (ref 4.0–10.5)
nRBC: 0 % (ref 0.0–0.2)

## 2020-03-18 LAB — SARS CORONAVIRUS 2 (TAT 6-24 HRS): SARS Coronavirus 2: NEGATIVE

## 2020-03-18 LAB — BASIC METABOLIC PANEL
Anion gap: 6 (ref 5–15)
BUN: 16 mg/dL (ref 8–23)
CO2: 27 mmol/L (ref 22–32)
Calcium: 9.1 mg/dL (ref 8.9–10.3)
Chloride: 108 mmol/L (ref 98–111)
Creatinine, Ser: 1.02 mg/dL (ref 0.61–1.24)
GFR calc Af Amer: >60 mL/min (ref >60)
GFR calc non Af Amer: >60 mL/min (ref >60)
Glucose, Bld: 99 mg/dL (ref 70–99)
Potassium: 4.4 mmol/L (ref 3.5–5.1)
Sodium: 141 mmol/L (ref 135–145)

## 2020-03-18 LAB — PROTIME-INR
INR: 1.1 (ref 0.8–1.2)
Prothrombin Time: 14.2 seconds (ref 11.4–15.2)

## 2020-03-18 LAB — APTT: aPTT: 37 seconds — ABNORMAL HIGH (ref 24–36)

## 2020-03-18 LAB — SURGICAL PCR SCREEN
MRSA, PCR: NEGATIVE
Staphylococcus aureus: NEGATIVE

## 2020-03-18 NOTE — Progress Notes (Signed)
PCP - Dr Wendie Agreste  [place]      Med clearance Cardiologist - na   Chest x-ray - na EKG -today  Stress Test na-  ECHO - 11/14 Cardiac Cath - na  Sleep Study - yes CPAP -no           Blood Thinner Instructions:coumadin  Stopped and bridge stated      Aspirin Instructions:no   COVID TEST- goimg today   Anesthesia review:  Hx htn  Patient denies shortness of breath, fever, cough and chest pain at PAT appointment   All instructions explained to the patient, with a verbal understanding of the material. Patient agrees to go over the instructions while at home for a better understanding. Patient also instructed to self quarantine after being tested for COVID-19. The opportunity to ask questions was provided.

## 2020-03-21 ENCOUNTER — Encounter (HOSPITAL_COMMUNITY): Payer: Self-pay

## 2020-03-21 ENCOUNTER — Other Ambulatory Visit: Payer: Self-pay | Admitting: Physician Assistant

## 2020-03-21 NOTE — Progress Notes (Signed)
Anesthesia Chart Review:  Case: [id number] Date/Time: 03/22/20 1421   Procedure: LEFT TOTAL HIP ARTHROPLASTY ANTERIOR APPROACH (Left Hip)   Anesthesia type: Choice   Pre-op diagnosis: osteoarthritis left hip   Location: [facility] OR ROOM 07 / [facility]   Surgeons: Mcarthur Rossetti, MD      DISCUSSION: Patient is a 64 year old male scheduled for the above procedure.  History includes former smoker (quit 1999), HTN, OSA (does not use CPCP), PE (right lung 2009; bilateral PE 10/15/13 post-op prostatectomy), RLD DVT (10/16/13 [place]), prostate cancer (s/p robotic assisted laparoscopic retropubic prostatectomy with bilateral pelvic lymph node dissection 10/07/13).  Per Dr. Trevor Mace notes, warfarin held for 5 days with Lovenox bridge for 4 days which he was coordinating with the patient's PCP.  03/18/20 presurgical COVID-19 test negative. Anesthesia is posted for Choice. He has mild thrombocytopenia (PLT count 134K) and minimally elevated aPTT of 37. He was on a Lovenox bridge at the time of his lab draw.  Will order a STAT PTT for day of surgery since he may be considered for spinal anesthesia--definitive anesthesia plan to be determined by assigned anesthesiologist.   VS: BP (!) 136/91   Pulse 66   Temp 36.8 C (Oral) Comment (Src): Simultaneous filing. User may not have seen previous data.  Resp 18   Ht 6\' 1"  (1.854 m)   Wt 129.4 kg   SpO2 98%   BMI 37.65 kg/m    PROVIDERS: [place], Marshall Cork., MD is PCP ([place], [place]). Records requested, but are still pending.   LABS: Preoperative labs noted. PLT count 134K, PT 14.2, INR 1.1, PTT 37.  (all labs ordered are listed, but only abnormal results are displayed)  Labs Reviewed  CBC - Abnormal; Notable for the following components:      Result Value   Platelets 134 (*)    All other components within normal limits  APTT - Abnormal; Notable for the following components:   aPTT 37 (*)    All other components within normal limits  SURGICAL PCR  SCREEN  BASIC METABOLIC PANEL  PROTIME-INR     EKG: 03/18/20: NSR. - Isolated negative T wave in III.   CV: Echo 10/16/13 (in setting of acute RLE DVT and PE): Study Conclusions  - Left ventricle: The cavity size was normal. Systolic  function was normal. The estimated ejection fraction was  in the range of 55% to 60%. Wall motion was normal; there  were no regional wall motion abnormalities.  - Pulmonary arteries: PA peak pressure: 55mm Hg (S).  - Impressions: Mild TR and no morphologic signs of cor  pulmonale  Impressions:  - Mild TR and no morphologic signs of cor pulmonale   BLE venous US 10/16/13:  Summary:  - Findings consistent with acute deep vein thrombosis  involving the right posterior tibial and peroneal veins.  An enlarged inguinallymph node is visualized bilaterally.  Incidental finding of moderate to severe, homogeneous  plaque in the right popliteal artery. The artery  demonstrates triphasic waveforms.  - No evidence of deep vein thrombosis involving the left  lower extremity.  - No evidence of Baker's cyst on the right or left.    Past Medical History:  Diagnosis Date  . Arthritis   . DVT (deep venous thrombosis) ([place])    right PT and peroneal veins DVT 10/16/13   . Gout no recent flares  . Hypertension   . Prostate cancer ([place])   . Pulmonary embolus ([facility])    right lung  2009; bilateral PE 10/15/13 post-prostatectomy  . Sleep apnea    does not use cpap, last sleep study more than 3 yrs ago    Past Surgical History:  Procedure Laterality Date  . APPENDECTOMY    . LYMPHADENECTOMY Bilateral 10/07/2013   Procedure: LYMPHADENECTOMY " BILATERAL PELVIC LYMPH NODE DISSECTION";  Surgeon: Bernestine Amass, MD;  Location: [facility];  Service: Urology;  Laterality: Bilateral;  . PROSTATE BIOPSY  2013 and oct 2014  . ROBOT ASSISTED LAPAROSCOPIC RADICAL PROSTATECTOMY N/A 10/07/2013   Procedure: ROBOTIC ASSISTED LAPAROSCOPIC RADICAL  PROSTATECTOMY;  Surgeon: Bernestine Amass, MD;  Location: [facility];  Service: Urology;  Laterality: N/A;    MEDICATIONS: . allopurinol (ZYLOPRIM) 100 MG tablet  . amLODipine (NORVASC) 5 MG tablet  . ibuprofen (ADVIL) 200 MG tablet  . warfarin (COUMADIN) 10 MG tablet   No current facility-administered medications for this encounter.    Myra Gianotti, PA-C Surgical Short Stay/Anesthesiology [facility] Phone [phone] [facility] Phone [phone] 03/21/2020 10:48 AM

## 2020-03-21 NOTE — Anesthesia Preprocedure Evaluation (Addendum)
Anesthesia Evaluation  Patient identified by MRN, date of birth, ID band Patient awake    Reviewed: Allergy & Precautions, NPO status , Patient's Chart, lab work & pertinent test results  History of Anesthesia Complications Negative for: history of anesthetic complications  Airway Mallampati: III  TM Distance: >3 FB Neck ROM: Full    Dental  (+) Teeth Intact, Dental Advisory Given   Pulmonary sleep apnea (does not use CPAP) , former smoker, PE (post op prostatectomy) 03/18/2020 SARS coronavirus NEG   breath sounds clear to auscultation       Cardiovascular hypertension, Pt. on medications (-) angina+ DVT   Rhythm:Regular Rate:Normal     Neuro/Psych negative neurological ROS     GI/Hepatic negative GI ROS, Neg liver ROS,   Endo/Other  Morbid obesity  Renal/GU negative Renal ROS     Musculoskeletal   Abdominal (+) + obese,   Peds  Hematology Coumadin last taken 03/15/2020 plt 134k, INR 1.1   Anesthesia Other Findings   Reproductive/Obstetrics                          Anesthesia Physical Anesthesia Plan  ASA: III  Anesthesia Plan: General   Post-op Pain Management:    Induction: Intravenous  PONV Risk Score and Plan: 1 and Ondansetron, Treatment may vary due to age or medical condition and Dexamethasone  Airway Management Planned: Oral ETT  Additional Equipment:   Intra-op Plan:   Post-operative Plan: Extubation in OR  Informed Consent: I have reviewed the patients History and Physical, chart, labs and discussed the procedure including the risks, benefits and alternatives for the proposed anesthesia with the patient or authorized representative who has indicated his/her understanding and acceptance.     Dental advisory given  Plan Discussed with: CRNA, Anesthesiologist and Surgeon  Anesthesia Plan Comments: (PAT note written 03/21/2020 by Myra Gianotti, PA-C. )     Anesthesia Quick Evaluation

## 2020-03-22 ENCOUNTER — Ambulatory Visit (HOSPITAL_COMMUNITY): Payer: Medicare HMO | Admitting: Vascular Surgery

## 2020-03-22 ENCOUNTER — Encounter (HOSPITAL_COMMUNITY)
Admission: RE | Disposition: A | Discharge status: Home / Self Care | Payer: Self-pay | Source: Home / Self Care | Attending: Orthopaedic Surgery

## 2020-03-22 ENCOUNTER — Ambulatory Visit (HOSPITAL_COMMUNITY): Payer: Medicare HMO

## 2020-03-22 ENCOUNTER — Observation Stay (HOSPITAL_COMMUNITY)
Admission: RE | Admit: 2020-03-22 | Discharge: 2020-03-23 | Disposition: A | Discharge status: Home / Self Care | Payer: Medicare HMO | Attending: Orthopaedic Surgery | Admitting: Orthopaedic Surgery

## 2020-03-22 ENCOUNTER — Other Ambulatory Visit: Payer: Self-pay

## 2020-03-22 ENCOUNTER — Observation Stay (HOSPITAL_COMMUNITY): Payer: Medicare HMO

## 2020-03-22 ENCOUNTER — Encounter (HOSPITAL_COMMUNITY): Payer: Self-pay | Admitting: Orthopaedic Surgery

## 2020-03-22 DIAGNOSIS — Z87891 Personal history of nicotine dependence: Secondary | ICD-10-CM | POA: Diagnosis not present

## 2020-03-22 DIAGNOSIS — Z6836 Body mass index (BMI) 36.0-36.9, adult: Secondary | ICD-10-CM | POA: Diagnosis not present

## 2020-03-22 DIAGNOSIS — Z86711 Personal history of pulmonary embolism: Secondary | ICD-10-CM | POA: Diagnosis not present

## 2020-03-22 DIAGNOSIS — Z8546 Personal history of malignant neoplasm of prostate: Secondary | ICD-10-CM | POA: Insufficient documentation

## 2020-03-22 DIAGNOSIS — M1612 Unilateral primary osteoarthritis, left hip: Principal | ICD-10-CM | POA: Diagnosis present

## 2020-03-22 DIAGNOSIS — I10 Essential (primary) hypertension: Secondary | ICD-10-CM | POA: Diagnosis not present

## 2020-03-22 DIAGNOSIS — G4730 Sleep apnea, unspecified: Secondary | ICD-10-CM | POA: Insufficient documentation

## 2020-03-22 DIAGNOSIS — Z9079 Acquired absence of other genital organ(s): Secondary | ICD-10-CM | POA: Diagnosis not present

## 2020-03-22 DIAGNOSIS — Z7901 Long term (current) use of anticoagulants: Secondary | ICD-10-CM | POA: Diagnosis not present

## 2020-03-22 DIAGNOSIS — Z96642 Presence of left artificial hip joint: Secondary | ICD-10-CM

## 2020-03-22 DIAGNOSIS — Z419 Encounter for procedure for purposes other than remedying health state, unspecified: Secondary | ICD-10-CM

## 2020-03-22 HISTORY — PX: TOTAL HIP ARTHROPLASTY: SHX124

## 2020-03-22 LAB — APTT: aPTT: 26 seconds (ref 24–36)

## 2020-03-22 SURGERY — ARTHROPLASTY, HIP, TOTAL, ANTERIOR APPROACH
Anesthesia: General | Site: Hip | Laterality: Left

## 2020-03-22 MED ORDER — PHENOL 1.4 % MT LIQD: 1.0000 | OROMUCOSAL | Status: DC | PRN

## 2020-03-22 MED ORDER — FENTANYL CITRATE (PF) 250 MCG/5ML IJ SOLN
INTRAMUSCULAR | Status: AC
  Filled 2020-03-22: qty 5

## 2020-03-22 MED ORDER — MENTHOL 3 MG MT LOZG: 1.0000 | LOZENGE | OROMUCOSAL | Status: DC | PRN

## 2020-03-22 MED ORDER — HYDROMORPHONE HCL 1 MG/ML IJ SOLN
INTRAMUSCULAR | Status: AC
  Filled 2020-03-22: qty 0.5

## 2020-03-22 MED ORDER — ENOXAPARIN SODIUM 40 MG/0.4ML ~~LOC~~ SOLN: 40.0000 mg | SUBCUTANEOUS | Status: DC

## 2020-03-22 MED ORDER — SUGAMMADEX SODIUM 500 MG/5ML IV SOLN
INTRAVENOUS | Status: AC
  Filled 2020-03-22: qty 5

## 2020-03-22 MED ORDER — WARFARIN SODIUM 5 MG PO TABS
10.0000 mg | ORAL_TABLET | Freq: Every day | ORAL | Status: DC
  Administered 2020-03-22: 10 mg via ORAL
  Filled 2020-03-22: qty 1

## 2020-03-22 MED ORDER — WARFARIN - PHARMACIST DOSING INPATIENT: Freq: Every day | Status: DC

## 2020-03-22 MED ORDER — ACETAMINOPHEN 10 MG/ML IV SOLN
INTRAVENOUS | Status: AC
  Filled 2020-03-22: qty 100

## 2020-03-22 MED ORDER — SODIUM CHLORIDE 0.9 % IV SOLN: INTRAVENOUS | Status: DC

## 2020-03-22 MED ORDER — PHENYLEPHRINE HCL-NACL 10-0.9 MG/250ML-% IV SOLN
INTRAVENOUS | Status: DC | PRN
  Administered 2020-03-22: 25 ug/min via INTRAVENOUS

## 2020-03-22 MED ORDER — PANTOPRAZOLE SODIUM 40 MG PO TBEC
40.0000 mg | DELAYED_RELEASE_TABLET | Freq: Every day | ORAL | Status: DC
  Administered 2020-03-22 – 2020-03-23 (×2): 40 mg via ORAL
  Filled 2020-03-22 (×2): qty 1

## 2020-03-22 MED ORDER — LIDOCAINE 2% (20 MG/ML) 5 ML SYRINGE
INTRAMUSCULAR | Status: DC | PRN
  Administered 2020-03-22: 40 mg via INTRAVENOUS

## 2020-03-22 MED ORDER — ESMOLOL HCL 100 MG/10ML IV SOLN
INTRAVENOUS | Status: DC | PRN
Start: 2020-03-22 — End: 2020-03-22
  Administered 2020-03-22: 20 mg via INTRAVENOUS

## 2020-03-22 MED ORDER — OXYCODONE HCL 5 MG PO TABS
10.0000 mg | ORAL_TABLET | ORAL | Status: DC | PRN
  Administered 2020-03-22 – 2020-03-23 (×2): 10 mg via ORAL
  Administered 2020-03-23: 09:00:00 15 mg via ORAL
  Filled 2020-03-22 (×2): qty 2
  Filled 2020-03-22: qty 3

## 2020-03-22 MED ORDER — ALLOPURINOL 100 MG PO TABS
100.0000 mg | ORAL_TABLET | Freq: Every day | ORAL | Status: DC
  Administered 2020-03-23: 100 mg via ORAL
  Filled 2020-03-22: qty 1

## 2020-03-22 MED ORDER — ONDANSETRON HCL 4 MG/2ML IJ SOLN: 4.0000 mg | Freq: Four times a day (QID) | INTRAMUSCULAR | Status: DC | PRN

## 2020-03-22 MED ORDER — MEPERIDINE HCL 25 MG/ML IJ SOLN: 6.2500 mg | INTRAMUSCULAR | Status: DC | PRN

## 2020-03-22 MED ORDER — HYDROMORPHONE HCL 1 MG/ML IJ SOLN
INTRAMUSCULAR | Status: AC
  Filled 2020-03-22: qty 1

## 2020-03-22 MED ORDER — AMLODIPINE BESYLATE 5 MG PO TABS
5.0000 mg | ORAL_TABLET | Freq: Every morning | ORAL | Status: DC
  Administered 2020-03-23: 5 mg via ORAL
  Filled 2020-03-22: qty 1

## 2020-03-22 MED ORDER — SODIUM CHLORIDE 0.9 % IR SOLN
Status: DC | PRN
  Administered 2020-03-22: 3000 mL

## 2020-03-22 MED ORDER — MIDAZOLAM HCL 2 MG/2ML IJ SOLN: 0.5000 mg | Freq: Once | INTRAMUSCULAR | Status: DC | PRN

## 2020-03-22 MED ORDER — METOCLOPRAMIDE HCL 5 MG/ML IJ SOLN: 5.0000 mg | Freq: Three times a day (TID) | INTRAMUSCULAR | Status: DC | PRN

## 2020-03-22 MED ORDER — ALBUMIN HUMAN 5 % IV SOLN: INTRAVENOUS | Status: DC | PRN

## 2020-03-22 MED ORDER — DEXAMETHASONE SODIUM PHOSPHATE 10 MG/ML IJ SOLN
INTRAMUSCULAR | Status: AC
  Filled 2020-03-22: qty 1

## 2020-03-22 MED ORDER — ACETAMINOPHEN 10 MG/ML IV SOLN
INTRAVENOUS | Status: DC | PRN
  Administered 2020-03-22: 1000 mg via INTRAVENOUS

## 2020-03-22 MED ORDER — ALUM & MAG HYDROXIDE-SIMETH 200-200-20 MG/5ML PO SUSP: 30.0000 mL | ORAL | Status: DC | PRN

## 2020-03-22 MED ORDER — EPHEDRINE SULFATE-NACL 50-0.9 MG/10ML-% IV SOSY
PREFILLED_SYRINGE | INTRAVENOUS | Status: DC | PRN
  Administered 2020-03-22: 10 mg via INTRAVENOUS

## 2020-03-22 MED ORDER — ACETAMINOPHEN 325 MG PO TABS: 325.0000 mg | ORAL_TABLET | Freq: Four times a day (QID) | ORAL | Status: DC | PRN

## 2020-03-22 MED ORDER — FENTANYL CITRATE (PF) 100 MCG/2ML IJ SOLN
INTRAMUSCULAR | Status: DC | PRN
  Administered 2020-03-22 (×5): 50 ug via INTRAVENOUS
  Administered 2020-03-22: 200 ug via INTRAVENOUS
  Administered 2020-03-22: 50 ug via INTRAVENOUS

## 2020-03-22 MED ORDER — POVIDONE-IODINE 10 % EX SWAB: 2.0000 "application " | Freq: Once | CUTANEOUS | Status: DC

## 2020-03-22 MED ORDER — MIDAZOLAM HCL 2 MG/2ML IJ SOLN
INTRAMUSCULAR | Status: AC
  Filled 2020-03-22: qty 2

## 2020-03-22 MED ORDER — DEXTROSE 5 % IV SOLN
3.0000 g | INTRAVENOUS | Status: AC
  Administered 2020-03-22: 3 g via INTRAVENOUS
  Filled 2020-03-22: qty 3

## 2020-03-22 MED ORDER — DOCUSATE SODIUM 100 MG PO CAPS
100.0000 mg | ORAL_CAPSULE | Freq: Two times a day (BID) | ORAL | Status: DC
  Administered 2020-03-22 – 2020-03-23 (×2): 100 mg via ORAL
  Filled 2020-03-22 (×2): qty 1

## 2020-03-22 MED ORDER — METHOCARBAMOL 1000 MG/10ML IJ SOLN
500.0000 mg | Freq: Four times a day (QID) | INTRAVENOUS | Status: DC | PRN
  Filled 2020-03-22: qty 5

## 2020-03-22 MED ORDER — PROMETHAZINE HCL 25 MG/ML IJ SOLN: 6.2500 mg | INTRAMUSCULAR | Status: DC | PRN

## 2020-03-22 MED ORDER — PROPOFOL 10 MG/ML IV BOLUS
INTRAVENOUS | Status: DC | PRN
  Administered 2020-03-22: 50 mg via INTRAVENOUS
  Administered 2020-03-22: 250 mg via INTRAVENOUS

## 2020-03-22 MED ORDER — LACTATED RINGERS IV SOLN: INTRAVENOUS | Status: DC | PRN

## 2020-03-22 MED ORDER — LACTATED RINGERS IV SOLN: INTRAVENOUS | Status: DC

## 2020-03-22 MED ORDER — TRANEXAMIC ACID-NACL 1000-0.7 MG/100ML-% IV SOLN
INTRAVENOUS | Status: AC
  Filled 2020-03-22: qty 100

## 2020-03-22 MED ORDER — CEFAZOLIN SODIUM-DEXTROSE 2-4 GM/100ML-% IV SOLN
2.0000 g | Freq: Four times a day (QID) | INTRAVENOUS | Status: AC
  Administered 2020-03-22 – 2020-03-23 (×2): 2 g via INTRAVENOUS
  Filled 2020-03-22 (×2): qty 100

## 2020-03-22 MED ORDER — HYDROMORPHONE HCL 1 MG/ML IJ SOLN
INTRAMUSCULAR | Status: DC | PRN
  Administered 2020-03-22 (×3): .5 mg via INTRAVENOUS

## 2020-03-22 MED ORDER — ONDANSETRON HCL 4 MG PO TABS: 4.0000 mg | ORAL_TABLET | Freq: Four times a day (QID) | ORAL | Status: DC | PRN

## 2020-03-22 MED ORDER — ESMOLOL HCL 100 MG/10ML IV SOLN
INTRAVENOUS | Status: AC
  Filled 2020-03-22: qty 10

## 2020-03-22 MED ORDER — OXYCODONE HCL 5 MG PO TABS
5.0000 mg | ORAL_TABLET | ORAL | Status: DC | PRN
  Administered 2020-03-23: 10 mg via ORAL
  Filled 2020-03-22: qty 2

## 2020-03-22 MED ORDER — PROPOFOL 10 MG/ML IV BOLUS
INTRAVENOUS | Status: AC
  Filled 2020-03-22: qty 20

## 2020-03-22 MED ORDER — SUGAMMADEX SODIUM 500 MG/5ML IV SOLN
INTRAVENOUS | Status: DC | PRN
  Administered 2020-03-22: 250 mg via INTRAVENOUS

## 2020-03-22 MED ORDER — DEXAMETHASONE SODIUM PHOSPHATE 4 MG/ML IJ SOLN
INTRAMUSCULAR | Status: DC | PRN
  Administered 2020-03-22: 4 mg via INTRAVENOUS

## 2020-03-22 MED ORDER — ROCURONIUM BROMIDE 10 MG/ML (PF) SYRINGE
PREFILLED_SYRINGE | INTRAVENOUS | Status: DC | PRN
  Administered 2020-03-22: 30 mg via INTRAVENOUS
  Administered 2020-03-22: 20 mg via INTRAVENOUS
  Administered 2020-03-22: 70 mg via INTRAVENOUS

## 2020-03-22 MED ORDER — MIDAZOLAM HCL 5 MG/5ML IJ SOLN
INTRAMUSCULAR | Status: DC | PRN
  Administered 2020-03-22: 2 mg via INTRAVENOUS

## 2020-03-22 MED ORDER — METOCLOPRAMIDE HCL 5 MG PO TABS: 5.0000 mg | ORAL_TABLET | Freq: Three times a day (TID) | ORAL | Status: DC | PRN

## 2020-03-22 MED ORDER — GABAPENTIN 100 MG PO CAPS
100.0000 mg | ORAL_CAPSULE | Freq: Three times a day (TID) | ORAL | Status: DC
  Administered 2020-03-22 – 2020-03-23 (×2): 100 mg via ORAL
  Filled 2020-03-22 (×2): qty 1

## 2020-03-22 MED ORDER — ONDANSETRON HCL 4 MG/2ML IJ SOLN
INTRAMUSCULAR | Status: DC | PRN
  Administered 2020-03-22: 4 mg via INTRAVENOUS

## 2020-03-22 MED ORDER — HYDROMORPHONE HCL 1 MG/ML IJ SOLN: 0.5000 mg | INTRAMUSCULAR | Status: DC | PRN

## 2020-03-22 MED ORDER — 0.9 % SODIUM CHLORIDE (POUR BTL) OPTIME
TOPICAL | Status: DC | PRN
  Administered 2020-03-22: 1000 mL

## 2020-03-22 MED ORDER — DIPHENHYDRAMINE HCL 12.5 MG/5ML PO ELIX: 12.5000 mg | ORAL_SOLUTION | ORAL | Status: DC | PRN

## 2020-03-22 MED ORDER — TRANEXAMIC ACID-NACL 1000-0.7 MG/100ML-% IV SOLN
1000.0000 mg | INTRAVENOUS | Status: AC
  Administered 2020-03-22: 1000 mg via INTRAVENOUS

## 2020-03-22 MED ORDER — HYDROMORPHONE HCL 1 MG/ML IJ SOLN
0.2500 mg | INTRAMUSCULAR | Status: DC | PRN
  Administered 2020-03-22 (×4): 0.5 mg via INTRAVENOUS

## 2020-03-22 MED ORDER — METHOCARBAMOL 500 MG PO TABS: 500.0000 mg | ORAL_TABLET | Freq: Four times a day (QID) | ORAL | Status: DC | PRN

## 2020-03-22 SURGICAL SUPPLY — 55 items
ARTICULEZE HEAD (Hips) ×3 IMPLANT
BALL HIP ARTICU EZE 36 8.5 (Hips) ×1 IMPLANT
BENZOIN TINCTURE PRP APPL 2/3 (GAUZE/BANDAGES/DRESSINGS) ×3 IMPLANT
BLADE SAW SGTL 18X1.27X75 (BLADE) ×2 IMPLANT
BLADE SAW SGTL 18X1.27X75MM (BLADE) ×1
CLOSURE WOUND 1/2 X4 (GAUZE/BANDAGES/DRESSINGS) ×2
COVER SURGICAL LIGHT HANDLE (MISCELLANEOUS) ×3 IMPLANT
CUP ACET PNNCL SECTR W/GRIP 56 (Hips) ×1 IMPLANT
DRAPE C-ARM 42X72 X-RAY (DRAPES) ×3 IMPLANT
DRAPE STERI IOBAN 125X83 (DRAPES) IMPLANT
DRAPE U-SHAPE 47X51 STRL (DRAPES) ×9 IMPLANT
DRSG AQUACEL AG ADV 3.5X10 (GAUZE/BANDAGES/DRESSINGS) ×3 IMPLANT
DURAPREP 26ML APPLICATOR (WOUND CARE) ×3 IMPLANT
ELECT BLADE 4.0 EZ CLEAN MEGAD (MISCELLANEOUS) ×3
ELECT BLADE 6.5 EXT (BLADE) IMPLANT
ELECT REM PT RETURN 9FT ADLT (ELECTROSURGICAL) ×3
ELECTRODE BLDE 4.0 EZ CLN MEGD (MISCELLANEOUS) ×1 IMPLANT
ELECTRODE REM PT RTRN 9FT ADLT (ELECTROSURGICAL) ×1 IMPLANT
FACESHIELD WRAPAROUND (MASK) ×9 IMPLANT
GAUZE XEROFORM 1X8 LF (GAUZE/BANDAGES/DRESSINGS) ×3 IMPLANT
GLOVE BIOGEL PI IND STRL 8 (GLOVE) ×2 IMPLANT
GLOVE BIOGEL PI INDICATOR 8 (GLOVE) ×4
GLOVE ECLIPSE 8.0 STRL XLNG CF (GLOVE) ×3 IMPLANT
GLOVE ORTHO TXT STRL SZ7.5 (GLOVE) ×6 IMPLANT
GOWN STRL REUS W/ TWL LRG LVL3 (GOWN DISPOSABLE) ×2 IMPLANT
GOWN STRL REUS W/ TWL XL LVL3 (GOWN DISPOSABLE) ×2 IMPLANT
GOWN STRL REUS W/TWL LRG LVL3 (GOWN DISPOSABLE) ×6
GOWN STRL REUS W/TWL XL LVL3 (GOWN DISPOSABLE) ×6
HANDPIECE INTERPULSE COAX TIP (DISPOSABLE) ×3
HEAD ARTICULEZE (Hips) ×1 IMPLANT
HIP BALL ARTICU EZE 36 8.5 (Hips) ×3 IMPLANT
KIT BASIN OR (CUSTOM PROCEDURE TRAY) ×3 IMPLANT
KIT TURNOVER KIT B (KITS) ×3 IMPLANT
LINER NEUTRAL 52MMX36MMX56N (Liner) ×3 IMPLANT
MANIFOLD NEPTUNE II (INSTRUMENTS) ×3 IMPLANT
NS IRRIG 1000ML POUR BTL (IV SOLUTION) ×3 IMPLANT
PACK TOTAL JOINT (CUSTOM PROCEDURE TRAY) ×3 IMPLANT
PAD ARMBOARD 7.5X6 YLW CONV (MISCELLANEOUS) ×3 IMPLANT
PINN SECTOR W/GRIP ACE CUP 56 (Hips) ×3 IMPLANT
PINNACLE ALTRX PLUS 4 N 36X56 (Hips) ×3 IMPLANT
SET HNDPC FAN SPRY TIP SCT (DISPOSABLE) ×1 IMPLANT
STAPLER VISISTAT 35W (STAPLE) ×3 IMPLANT
STEM FEM ACTIS HIGH SZ7 (Stem) ×3 IMPLANT
STRIP CLOSURE SKIN 1/2X4 (GAUZE/BANDAGES/DRESSINGS) ×4 IMPLANT
SUT ETHIBOND NAB CT1 #1 30IN (SUTURE) ×3 IMPLANT
SUT MNCRL AB 4-0 PS2 18 (SUTURE) IMPLANT
SUT VIC AB 0 CT1 27 (SUTURE) ×3
SUT VIC AB 0 CT1 27XBRD ANBCTR (SUTURE) ×1 IMPLANT
SUT VIC AB 1 CT1 27 (SUTURE) ×3
SUT VIC AB 1 CT1 27XBRD ANBCTR (SUTURE) ×1 IMPLANT
SUT VIC AB 2-0 CT1 27 (SUTURE) ×3
SUT VIC AB 2-0 CT1 TAPERPNT 27 (SUTURE) ×1 IMPLANT
TOWEL GREEN STERILE (TOWEL DISPOSABLE) ×3 IMPLANT
TOWEL GREEN STERILE FF (TOWEL DISPOSABLE) ×3 IMPLANT
WATER STERILE IRR 1000ML POUR (IV SOLUTION) ×6 IMPLANT

## 2020-03-22 NOTE — Anesthesia Procedure Notes (Signed)
Procedure Name: Intubation Date/Time: 03/22/2020 3:23 PM Performed by: Renato Shin, CRNA Pre-anesthesia Checklist: Patient identified, Emergency Drugs available, Suction available and Patient being monitored Patient Re-evaluated:Patient Re-evaluated prior to induction Oxygen Delivery Method: Circle system utilized Preoxygenation: Pre-oxygenation with 100% oxygen Induction Type: IV induction Ventilation: Mask ventilation without difficulty and Oral airway inserted - appropriate to patient size Laryngoscope Size: Glidescope and 4 Tube type: Oral Tube size: 7.5 mm Number of attempts: 1 Airway Equipment and Method: Oral airway,  Video-laryngoscopy and Rigid stylet Placement Confirmation: ETT inserted through vocal cords under direct vision,  positive ETCO2 and breath sounds checked- equal and bilateral Secured at: 21 cm Tube secured with: Tape Dental Injury: Teeth and Oropharynx as per pre-operative assessment

## 2020-03-22 NOTE — Brief Op Note (Signed)
03/22/2020  5:11 PM  PATIENT:  Dwayne Lawrence  64 y.o. male  PRE-OPERATIVE DIAGNOSIS:  osteoarthritis left hip  POST-OPERATIVE DIAGNOSIS:  osteoarthritis left hip  PROCEDURE:  Procedure(s): LEFT TOTAL HIP ARTHROPLASTY ANTERIOR APPROACH (Left)  SURGEON:  Surgeon(s) and Role:    Mcarthur Rossetti, MD - Primary  PHYSICIAN ASSISTANT: Benita Stabile, PA-C  ANESTHESIA:   general  EBL:  400 mL   COUNTS:  YES  DICTATION: .Other Dictation: Dictation Number [id number]  PLAN OF CARE: Admit for overnight observation  PATIENT DISPOSITION:  PACU - hemodynamically stable.   Delay start of Pharmacological VTE agent (>24hrs) due to surgical blood loss or risk of bleeding: no

## 2020-03-22 NOTE — Anesthesia Postprocedure Evaluation (Signed)
Anesthesia Post Note  Patient: Dwayne Lawrence  Procedure(s) Performed: LEFT TOTAL HIP ARTHROPLASTY ANTERIOR APPROACH (Left Hip)     Patient location during evaluation: PACU Anesthesia Type: Spinal Level of consciousness: awake and alert, oriented and patient cooperative Pain control: pain improving. Vital Signs Assessment: post-procedure vital signs reviewed and stable Respiratory status: spontaneous breathing, nonlabored ventilation, respiratory function stable and patient connected to nasal cannula oxygen Cardiovascular status: blood pressure returned to baseline and stable Postop Assessment: no apparent nausea or vomiting Anesthetic complications: no    Last Vitals:  Vitals:   03/22/20 1819 03/22/20 1835  BP: (!) 160/93 (!) 162/95  Pulse: 74 67  Resp: 13 13  Temp:    SpO2: 97% 92%    Last Pain:  Vitals:   03/22/20 1835  TempSrc:   PainSc: 7                  Sirenity Shew,E. Yanis Larin

## 2020-03-22 NOTE — Transfer of Care (Signed)
Immediate Anesthesia Transfer of Care Note  Patient: Dwayne Lawrence  Procedure(s) Performed: LEFT TOTAL HIP ARTHROPLASTY ANTERIOR APPROACH (Left Hip)  Patient Location: PACU  Anesthesia Type:General  Level of Consciousness: awake and patient cooperative  Airway & Oxygen Therapy: Patient Spontanous Breathing and Patient connected to face mask oxygen  Post-op Assessment: Report given to RN and Post -op Vital signs reviewed and stable  Post vital signs: Reviewed and stable  Last Vitals:  Vitals Value Taken Time  BP 134/89 03/22/20 1735  Temp    Pulse 82 03/22/20 1738  Resp 18 03/22/20 1738  SpO2 100 % 03/22/20 1738  Vitals shown include unvalidated device data.  Last Pain:  Vitals:   03/22/20 1231  TempSrc:   PainSc: 1       Patients Stated Pain Goal: 3 ([phone] [id number])  Complications: No apparent anesthesia complications

## 2020-03-22 NOTE — Progress Notes (Signed)
ANTICOAGULATION CONSULT NOTE - Initial Consult  Pharmacy Consult for Warfarin Indication: pulmonary embolus and DVT  Allergies  Allergen Reactions  . Iodine Hives    Patient Measurements: Height: 6\' 1"  (185.4 cm) Weight: 124.7 kg (275 lb) IBW/kg (Calculated) : 79.9  Vital Signs: Temp: (P) 97.5 F (36.4 C) (05/04 1745) Temp Source: Oral (05/04 1218) BP: 132/83 (05/04 1218) Pulse Rate: 64 (05/04 1218)  Labs: Recent Labs    03/22/20 1205  APTT 26    Estimated Creatinine Clearance: 99.9 mL/min (by C-G formula based on SCr of 1.02 mg/dL).   Medical History: Past Medical History:  Diagnosis Date  . Arthritis   . DVT (deep venous thrombosis) ([place])    right PT and peroneal veins DVT 10/16/13   . Gout no recent flares  . Hypertension   . Prostate cancer ([place])   . Pulmonary embolus ([place])    right lung 2009; bilateral PE 10/15/13 post-prostatectomy  . Sleep apnea    does not use cpap, last sleep study more than 3 yrs ago    Medications:  Medications Prior to Admission  Medication Sig Dispense Refill Last Dose  . allopurinol (ZYLOPRIM) 100 MG tablet Take 100 mg by mouth daily.   03/22/2020 at 0800  . amLODipine (NORVASC) 5 MG tablet Take 5 mg by mouth every morning.   03/22/2020 at 0800  . warfarin (COUMADIN) 10 MG tablet Take 10 mg by mouth daily.   03/15/2020  . ibuprofen (ADVIL) 200 MG tablet Take 400 mg by mouth every 8 (eight) hours as needed (hip pain.).   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 Last dose of warfarin was 4/27.   INR 1.1 (4/30)  Goal of Therapy:  INR 2-3   Plan:  Resume warfarin 10 mg daily.  F/U INR  Ulice Dash D 03/22/2020,5:47 PM

## 2020-03-22 NOTE — Progress Notes (Signed)
Xrays completed as ordered. 

## 2020-03-22 NOTE — H&P (Signed)
TOTAL HIP ADMISSION H&P  Patient is admitted for left total hip arthroplasty.  Subjective:  Chief Complaint: left hip pain  HPI: Dwayne Lawrence, 64 y.o. male, has a history of pain and functional disability in the left hip(s) due to arthritis and patient has failed non-surgical conservative treatments for greater than 12 weeks to include NSAID's and/or analgesics, corticosteriod injections, flexibility and strengthening excercises, use of assistive devices, weight reduction as appropriate and activity modification.  Onset of symptoms was gradual starting 4 years ago with gradually worsening course since that time.The patient noted no past surgery on the left hip(s).  Patient currently rates pain in the left hip at 10 out of 10 with activity. Patient has night pain, worsening of pain with activity and weight bearing, trendelenberg gait, pain that interfers with activities of daily living and pain with passive range of motion. Patient has evidence of subchondral cysts, subchondral sclerosis, periarticular osteophytes and joint space narrowing by imaging studies. This condition presents safety issues increasing the risk of falls.  There is no current active infection.  Patient Active Problem List   Diagnosis Date Noted  . Unilateral primary osteoarthritis, left hip 02/29/2020  . HCAP (healthcare-associated pneumonia) 10/15/2013  . S/P prostatectomy 10/15/2013  . SOB (shortness of breath) 10/15/2013  . HTN (hypertension) 10/15/2013   Past Medical History:  Diagnosis Date  . Arthritis   . DVT (deep venous thrombosis) ([place])    right PT and peroneal veins DVT 10/16/13   . Gout no recent flares  . Hypertension   . Prostate cancer ([place])   . Pulmonary embolus ([place])    right lung 2009; bilateral PE 10/15/13 post-prostatectomy  . Sleep apnea    does not use cpap, last sleep study more than 3 yrs ago    Past Surgical History:  Procedure Laterality Date  . APPENDECTOMY    . LYMPHADENECTOMY  Bilateral 10/07/2013   Procedure: LYMPHADENECTOMY " BILATERAL PELVIC LYMPH NODE DISSECTION";  Surgeon: Bernestine Amass, MD;  Location: [facility];  Service: Urology;  Laterality: Bilateral;  . PROSTATE BIOPSY  2013 and oct 2014  . ROBOT ASSISTED LAPAROSCOPIC RADICAL PROSTATECTOMY N/A 10/07/2013   Procedure: ROBOTIC ASSISTED LAPAROSCOPIC RADICAL PROSTATECTOMY;  Surgeon: Bernestine Amass, MD;  Location: [facility];  Service: Urology;  Laterality: N/A;    Current Facility-Administered Medications  Medication Dose Route Frequency Provider Last Rate Last Admin  . [START ON 03/23/2020] ceFAZolin (ANCEF) 3 g in dextrose 5 % 50 mL IVPB  3 g Intravenous On Call to OR Pete Pelt, PA-C      . lactated ringers infusion   Intravenous Continuous Roberts Gaudy, MD      . povidone-iodine 10 % swab 2 application  2 application Topical Once Erskine Emery W, PA-C      . tranexamic acid (CYKLOKAPRON) 1000MG /164mL IVPB           . tranexamic acid (CYKLOKAPRON) IVPB 1,000 mg  1,000 mg Intravenous To OR Pete Pelt, PA-C       Allergies  Allergen Reactions  . Iodine Hives    Social History   Tobacco Use  . Smoking status: Former Smoker    Types: Pipe    Quit date: 11/19/1997    Years since quitting: 22.3  . Smokeless tobacco: Former Systems developer    Types: [place] date: 11/19/1997  Substance Use Topics  . Alcohol use: Yes    Comment: 2 drinks per day    History reviewed. No pertinent family  history.   Review of Systems  All other systems reviewed and are negative.   Objective:  Physical Exam  Constitutional: He is oriented to person, place, and time. He appears well-developed and well-nourished.  HENT:  Head: Normocephalic and atraumatic.  Eyes: Pupils are equal, round, and reactive to light. EOM are normal.  Cardiovascular: Normal rate.  Respiratory: Effort normal.  GI: Soft.  Musculoskeletal:     Cervical back: Normal range of motion and neck supple.     Left hip: Tenderness and bony tenderness  present. Decreased range of motion. Decreased strength.  Neurological: He is alert and oriented to person, place, and time.  Skin: Skin is warm and dry.  Psychiatric: He has a normal mood and affect.    Vital signs in last 24 hours: Temp:  [98.2 F (36.8 C)] 98.2 F (36.8 C) (05/04 1218) Pulse Rate:  [64] 64 (05/04 1218) Resp:  [17] 17 (05/04 1218) BP: (132)/(83) 132/83 (05/04 1218) SpO2:  [97 %] 97 % (05/04 1218) Weight:  [124.7 kg] 124.7 kg (05/04 1218)  Labs:   Estimated body mass index is 36.28 kg/m as calculated from the following:   Height as of this encounter: 6\' 1"  (1.854 m).   Weight as of this encounter: 124.7 kg.   Imaging Review Plain radiographs demonstrate severe degenerative joint disease of the left hip(s). The bone quality appears to be good for age and reported activity level.      Assessment/Plan:  End stage arthritis, left hip(s)  The patient history, physical examination, clinical judgement of the provider and imaging studies are consistent with end stage degenerative joint disease of the left hip(s) and total hip arthroplasty is deemed medically necessary. The treatment options including medical management, injection therapy, arthroscopy and arthroplasty were discussed at length. The risks and benefits of total hip arthroplasty were presented and reviewed. The risks due to aseptic loosening, infection, stiffness, dislocation/subluxation,  thromboembolic complications and other imponderables were discussed.  The patient acknowledged the explanation, agreed to proceed with the plan and consent was signed. Patient is being admitted for inpatient treatment for surgery, pain control, PT, OT, prophylactic antibiotics, VTE prophylaxis, progressive ambulation and ADL's and discharge planning.The patient is planning to be discharged home with home health services

## 2020-03-23 DIAGNOSIS — M1612 Unilateral primary osteoarthritis, left hip: Secondary | ICD-10-CM | POA: Diagnosis not present

## 2020-03-23 LAB — BASIC METABOLIC PANEL
Anion gap: 8 (ref 5–15)
BUN: 11 mg/dL (ref 8–23)
CO2: 23 mmol/L (ref 22–32)
Calcium: 8.8 mg/dL — ABNORMAL LOW (ref 8.9–10.3)
Chloride: 104 mmol/L (ref 98–111)
Creatinine, Ser: 1.05 mg/dL (ref 0.61–1.24)
GFR calc Af Amer: >60 mL/min (ref >60)
GFR calc non Af Amer: >60 mL/min (ref >60)
Glucose, Bld: 153 mg/dL — ABNORMAL HIGH (ref 70–99)
Potassium: 4.4 mmol/L (ref 3.5–5.1)
Sodium: 135 mmol/L (ref 135–145)

## 2020-03-23 LAB — CBC
HCT: 40.7 % (ref 39.0–52.0)
Hemoglobin: 12.9 g/dL — ABNORMAL LOW (ref 13.0–17.0)
MCH: 29.1 pg (ref 26.0–34.0)
MCHC: 31.7 g/dL (ref 30.0–36.0)
MCV: 91.7 fL (ref 80.0–100.0)
Platelets: 123 10*3/uL — ABNORMAL LOW (ref 150–400)
RBC: 4.44 MIL/uL (ref 4.22–5.81)
RDW: 13.7 % (ref 11.5–15.5)
WBC: 9.5 10*3/uL (ref 4.0–10.5)
nRBC: 0 % (ref 0.0–0.2)

## 2020-03-23 LAB — PROTIME-INR
INR: 1.1 (ref 0.8–1.2)
Prothrombin Time: 13.6 seconds (ref 11.4–15.2)

## 2020-03-23 MED ORDER — METHOCARBAMOL 500 MG PO TABS: 500.0000 mg | ORAL_TABLET | Freq: Four times a day (QID) | ORAL | 0 refills | Status: DC | PRN

## 2020-03-23 MED ORDER — WARFARIN SODIUM 7.5 MG PO TABS: 12.5000 mg | ORAL_TABLET | Freq: Once | ORAL | Status: DC

## 2020-03-23 MED ORDER — ENOXAPARIN SODIUM 40 MG/0.4ML ~~LOC~~ SOLN
40.0000 mg | SUBCUTANEOUS | Status: DC
  Administered 2020-03-23: 40 mg via SUBCUTANEOUS
  Filled 2020-03-23: qty 0.4

## 2020-03-23 MED ORDER — OXYCODONE HCL 5 MG PO TABS: 5.0000 mg | ORAL_TABLET | ORAL | 0 refills | Status: DC | PRN

## 2020-03-23 NOTE — Progress Notes (Signed)
Patient ID: Dwayne Lawrence, male   DOB: [birth year], 64 y.o.   MRN: [id number] Looks good overall.  Mage great progress with therapy.  Can be discharged to home later this afternoon.

## 2020-03-23 NOTE — TOC Transition Note (Signed)
Transition of Care [facility]) - CM/SW Discharge Note   Patient Details  Name: Dwayne Lawrence MRN: [id number] Date of Birth: 12-22-1955  [facility] CM/SW Contact:  Carles Collet, RN Phone Number: 03/23/2020, 12:24 PM   Clinical Narrative:   Ordered 3/1 to room for DC. No other CM needs identified. Patient has RW at home already.          Patient Goals and CMS Choice        Discharge Placement                       Discharge Plan and Services                DME Arranged: 3-N-1 DME Agency: [employer] Date DME Agency Contacted: 03/23/20 Time DME Agency Contacted: [phone] Representative spoke with at DME Agency: [place] ([place]) Interventions     Readmission Risk Interventions No flowsheet data found.

## 2020-03-23 NOTE — Progress Notes (Signed)
Subjective: 1 Day Post-Op Procedure(s) (LRB): LEFT TOTAL HIP ARTHROPLASTY ANTERIOR APPROACH (Left) Patient reports pain as moderate.    Objective: Vital signs in last 24 hours: Temp:  [97.4 F (36.3 C)-98.2 F (36.8 C)] 97.8 F (36.6 C) (05/05 0408) Pulse Rate:  [64-82] 77 (05/05 0408) Resp:  [8-18] 18 (05/05 0408) BP: (132-162)/(83-97) 145/94 (05/05 0408) SpO2:  [91 %-100 %] 98 % (05/05 0408) Weight:  [124.7 kg] 124.7 kg (05/04 1218)  Intake/Output from previous day: 05/04 0701 - 05/05 0700 In: 2344.5 [P.O.:120; I.V.:1724.5; IV Piggyback:500] Out: 400 [Blood:400] Intake/Output this shift: No intake/output data recorded.  Recent Labs    03/23/20 0504  HGB 12.9*   Recent Labs    03/23/20 0504  WBC 9.5  RBC 4.44  HCT 40.7  PLT 123*   Recent Labs    03/23/20 0504  NA 135  K 4.4  CL 104  CO2 23  BUN 11  CREATININE 1.05  GLUCOSE 153*  CALCIUM 8.8*   Recent Labs    03/23/20 0504  INR 1.1    Sensation intact distally Intact pulses distally Dorsiflexion/Plantar flexion intact Incision: dressing C/D/I   Assessment/Plan: 1 Day Post-Op Procedure(s) (LRB): LEFT TOTAL HIP ARTHROPLASTY ANTERIOR APPROACH (Left) Up with therapy      Mcarthur Rossetti 03/23/2020, 8:29 AM

## 2020-03-23 NOTE — Progress Notes (Signed)
Patient discharging home. Discharge instructions explained to patient and he verbalized understanding. Packed all personal belongings. No further questions or concerns voiced.  

## 2020-03-23 NOTE — Progress Notes (Signed)
Physical Therapy Treatment Patient Details Name: Dwayne Lawrence MRN: [id number] DOB: 1955-12-26 Today's Date: 03/23/2020    History of Present Illness Pt is a 64 y.o. M with significant PMH of HTN and prostatectomy who presents s/p left total hip arthroplasty, direct anterior approach.    PT Comments    Pt stiff/sore after sitting up for several hours in the chair. Session focused on demonstration of car transfer technique, stair training, and education. Pt able to negotiate one platform step with a walker to simulate home set up. Ambulating room distances with a walker at a supervision level (ambulation distance not a focus as pt preparing to discharge). Education reinforced regarding HEP, activity recommendations and progression. Pt and pt spouse verbalized understanding.     Follow Up Recommendations  Outpatient PT;Supervision for mobility/OOB     Equipment Recommendations  3in1 (PT)    Recommendations for Other Services       Precautions / Restrictions Precautions Precautions: None Restrictions Weight Bearing Restrictions: Yes LLE Weight Bearing: Weight bearing as tolerated    Mobility  Bed Mobility Overal bed mobility: Needs Assistance Bed Mobility: Sit to Supine     Supine to sit: Supervision     General bed mobility comments: Increased time/effort, but able to progress to supine without physical assist  Transfers Overall transfer level: Needs assistance Equipment used: Rolling walker (2 wheeled) Transfers: Sit to/from Stand Sit to Stand: Supervision         General transfer comment: Supervision to stand from recliner  Ambulation/Gait Ambulation/Gait assistance: Min guard;Supervision Gait Distance (Feet): 10 Feet Assistive device: Rolling walker (2 wheeled) Gait Pattern/deviations: Step-through pattern;Antalgic;Decreased stance time - left Gait velocity: decreased   General Gait Details: Supervision-min guard for room ambulation, antalgic gait pattern,  increased trunk flexion. Cues for upright posture   Stairs Stairs: Yes Stairs assistance: Min guard Stair Management: No rails;With walker Number of Stairs: 1 General stair comments: Pt ascended/descended platform step with use of walker and min guard assist. Cues for sequencing, positioning.   Wheelchair Mobility    Modified Rankin (Stroke Patients Only)       Balance Overall balance assessment: Mild deficits observed, not formally tested                                          Cognition Arousal/Alertness: Awake/alert Behavior During Therapy: WFL for tasks assessed/performed Overall Cognitive Status: Within Functional Limits for tasks assessed                                        Exercises      General Comments        Pertinent Vitals/Pain Pain Assessment: Faces Faces Pain Scale: Hurts little more Pain Location: L hip Pain Descriptors / Indicators: Grimacing;Guarding;Sore;Other (Comment)(stiff) Pain Intervention(s): Limited activity within patient's tolerance;Monitored during session    Home Living                      Prior Function            PT Goals (current goals can now be found in the care plan section) Acute Rehab PT Goals Patient Stated Goal: "go home." PT Goal Formulation: With patient Time For Goal Achievement: 04/06/20 Potential to Achieve Goals: Good Progress towards PT goals: Progressing toward goals  Frequency    7X/week      PT Plan Current plan remains appropriate    Co-evaluation              AM-PAC PT "6 Clicks" Mobility   Outcome Measure  Help needed turning from your back to your side while in a flat bed without using bedrails?: None Help needed moving from lying on your back to sitting on the side of a flat bed without using bedrails?: A Little Help needed moving to and from a bed to a chair (including a wheelchair)?: A Little Help needed standing up from a chair  using your arms (e.g., wheelchair or bedside chair)?: A Little Help needed to walk in hospital room?: A Little Help needed climbing 3-5 steps with a railing? : A Little 6 Click Score: 19    End of Session   Activity Tolerance: Patient tolerated treatment well Patient left: with call bell/phone within reach;in bed;with family/visitor present Nurse Communication: Mobility status PT Visit Diagnosis: Pain;Difficulty in walking, not elsewhere classified (R26.2) Pain - Right/Left: Left Pain - part of body: Hip     Time: 1353-1415 PT Time Calculation (min) (ACUTE ONLY): 22 min  Charges:  $Therapeutic Activity: 8-22 mins                      Wyona Almas, PT, DPT Acute Rehabilitation Services Pager [phone] Office [phone]   [name] 03/23/2020, 3:43 PM

## 2020-03-23 NOTE — Discharge Instructions (Addendum)
Information on my medicine - Coumadin   (Warfarin)  Why was Coumadin prescribed for you? Coumadin was prescribed for you because you have a blood clot or a medical condition that can cause an increased risk of forming blood clots. Blood clots can cause serious health problems by blocking the flow of blood to the heart, lung, or brain. Coumadin can prevent harmful blood clots from forming. As a reminder your indication for Coumadin is:   Pulmonary Embolism Treatment  What test will check on my response to Coumadin? While on Coumadin (warfarin) you will need to have an INR test regularly to ensure that your dose is keeping you in the desired range. The INR (international normalized ratio) number is calculated from the result of the laboratory test called prothrombin time (PT).  If an INR APPOINTMENT HAS NOT ALREADY BEEN MADE FOR YOU please schedule an appointment to have this lab work done by your health care provider within 7 days. Your INR goal is usually a number between:  2 to 3 or your provider may give you a more narrow range like 2-2.5.  Ask your health care provider during an office visit what your goal INR is.  What  do you need to  know  About  COUMADIN? Take Coumadin (warfarin) exactly as prescribed by your healthcare provider about the same time each day.  DO NOT stop taking without talking to the doctor who prescribed the medication.  Stopping without other blood clot prevention medication to take the place of Coumadin may increase your risk of developing a new clot or stroke.  Get refills before you run out.  What do you do if you miss a dose? If you miss a dose, take it as soon as you remember on the same day then continue your regularly scheduled regimen the next day.  Do not take two doses of Coumadin at the same time.  Important Safety Information A possible side effect of Coumadin (Warfarin) is an increased risk of bleeding. You should call your healthcare provider right away if  you experience any of the following: ? Bleeding from an injury or your nose that does not stop. ? Unusual colored urine (red or dark brown) or unusual colored stools (red or black). ? Unusual bruising for unknown reasons. ? A serious fall or if you hit your head (even if there is no bleeding).  Some foods or medicines interact with Coumadin (warfarin) and might alter your response to warfarin. To help avoid this: ? Eat a balanced diet, maintaining a consistent amount of Vitamin K. ? Notify your provider about major diet changes you plan to make. ? Avoid alcohol or limit your intake to 1 drink for women and 2 drinks for men per day. (1 drink is 5 oz. wine, 12 oz. beer, or 1.5 oz. liquor.)  Make sure that ANY health care provider who prescribes medication for you knows that you are taking Coumadin (warfarin).  Also make sure the healthcare provider who is monitoring your Coumadin knows when you have started a new medication including herbals and non-prescription products.  Coumadin (Warfarin)  Major Drug Interactions  Increased Warfarin Effect Decreased Warfarin Effect  Alcohol (large quantities) Antibiotics (esp. Septra/Bactrim, Flagyl, Cipro) Amiodarone (Cordarone) Aspirin (ASA) Cimetidine (Tagamet) Megestrol (Megace) NSAIDs (ibuprofen, naproxen, etc.) Piroxicam (Feldene) Propafenone (Rythmol SR) Propranolol (Inderal) Isoniazid (INH) Posaconazole (Noxafil) Barbiturates (Phenobarbital) Carbamazepine (Tegretol) Chlordiazepoxide (Librium) Cholestyramine (Questran) Griseofulvin Oral Contraceptives Rifampin Sucralfate (Carafate) Vitamin K   Coumadin (Warfarin) Major Herbal Interactions  Increased  Warfarin Effect Decreased Warfarin Effect  Garlic Ginseng Ginkgo biloba Coenzyme Q10 Green tea St. John's wort    Coumadin (Warfarin) FOOD Interactions  Eat a consistent number of servings per week of foods HIGH in Vitamin K (1 serving =  cup)  Collards (cooked, or boiled &  drained) Kale (cooked, or boiled & drained) Mustard greens (cooked, or boiled & drained) Parsley *serving size only =  cup Spinach (cooked, or boiled & drained) Swiss chard (cooked, or boiled & drained) Turnip greens (cooked, or boiled & drained)  Eat a consistent number of servings per week of foods MEDIUM-HIGH in Vitamin K (1 serving = 1 cup)  Asparagus (cooked, or boiled & drained) Broccoli (cooked, boiled & drained, or raw & chopped) Brussel sprouts (cooked, or boiled & drained) *serving size only =  cup Lettuce, raw (green leaf, endive, romaine) Spinach, raw Turnip greens, raw & chopped   These websites have more information on Coumadin (warfarin):  FailFactory.se; VeganReport.com.au;   INSTRUCTIONS AFTER JOINT REPLACEMENT   o Remove items at home which could result in a fall. This includes throw rugs or furniture in walking pathways o ICE to the affected joint every three hours while awake for 30 minutes at a time, for at least the first 3-5 days, and then as needed for pain and swelling.  Continue to use ice for pain and swelling. You may notice swelling that will progress down to the foot and ankle.  This is normal after surgery.  Elevate your leg when you are not up walking on it.   o Continue to use the breathing machine you got in the hospital (incentive spirometer) which will help keep your temperature down.  It is common for your temperature to cycle up and down following surgery, especially at night when you are not up moving around and exerting yourself.  The breathing machine keeps your lungs expanded and your temperature down.   DIET:  As you were doing prior to hospitalization, we recommend a well-balanced diet.  DRESSING / WOUND CARE / SHOWERING  Keep the surgical dressing until follow up.  The dressing is water proof, so you can shower without any extra covering.  IF THE DRESSING FALLS OFF or the wound gets wet inside, change the dressing with  sterile gauze.  Please use good hand washing techniques before changing the dressing.  Do not use any lotions or creams on the incision until instructed by your surgeon.    ACTIVITY  o Increase activity slowly as tolerated, but follow the weight bearing instructions below.   o No driving for 6 weeks or until further direction given by your physician.  You cannot drive while taking narcotics.  o No lifting or carrying greater than 10 lbs. until further directed by your surgeon. o Avoid periods of inactivity such as sitting longer than an hour when not asleep. This helps prevent blood clots.  o You may return to work once you are authorized by your doctor.     WEIGHT BEARING   Weight bearing as tolerated with assist device (walker, cane, etc) as directed, use it as long as suggested by your surgeon or therapist, typically at least 4-6 weeks.   EXERCISES  Results after joint replacement surgery are often greatly improved when you follow the exercise, range of motion and muscle strengthening exercises prescribed by your doctor. Safety measures are also important to protect the joint from further injury. Any time any of these exercises cause you to have increased pain  or swelling, decrease what you are doing until you are comfortable again and then slowly increase them. If you have problems or questions, call your caregiver or physical therapist for advice.   Rehabilitation is important following a joint replacement. After just a few days of immobilization, the muscles of the leg can become weakened and shrink (atrophy).  These exercises are designed to build up the tone and strength of the thigh and leg muscles and to improve motion. Often times heat used for twenty to thirty minutes before working out will loosen up your tissues and help with improving the range of motion but do not use heat for the first two weeks following surgery (sometimes heat can increase post-operative swelling).   These  exercises can be done on a training (exercise) mat, on the floor, on a table or on a bed. Use whatever works the best and is most comfortable for you.    Use music or television while you are exercising so that the exercises are a pleasant break in your day. This will make your life better with the exercises acting as a break in your routine that you can look forward to.   Perform all exercises about fifteen times, three times per day or as directed.  You should exercise both the operative leg and the other leg as well.  Exercises include:   . Quad Sets - Tighten up the muscle on the front of the thigh (Quad) and hold for 5-10 seconds.   . Straight Leg Raises - With your knee straight (if you were given a brace, keep it on), lift the leg to 60 degrees, hold for 3 seconds, and slowly lower the leg.  Perform this exercise against resistance later as your leg gets stronger.  . Leg Slides: Lying on your back, slowly slide your foot toward your buttocks, bending your knee up off the floor (only go as far as is comfortable). Then slowly slide your foot back down until your leg is flat on the floor again.  Glenard Haring Wings: Lying on your back spread your legs to the side as far apart as you can without causing discomfort.  . Hamstring Strength:  Lying on your back, push your heel against the floor with your leg straight by tightening up the muscles of your buttocks.  Repeat, but this time bend your knee to a comfortable angle, and push your heel against the floor.  You may put a pillow under the heel to make it more comfortable if necessary.   A rehabilitation program following joint replacement surgery can speed recovery and prevent re-injury in the future due to weakened muscles. Contact your doctor or a physical therapist for more information on knee rehabilitation.    CONSTIPATION  Constipation is defined medically as fewer than three stools per week and severe constipation as less than one stool per week.   Even if you have a regular bowel pattern at home, your normal regimen is likely to be disrupted due to multiple reasons following surgery.  Combination of anesthesia, postoperative narcotics, change in appetite and fluid intake all can affect your bowels.   YOU MUST use at least one of the following options; they are listed in order of increasing strength to get the job done.  They are all available over the counter, and you may need to use some, POSSIBLY even all of these options:    Drink plenty of fluids (prune juice may be helpful) and high fiber foods Colace 100  mg by mouth twice a day  Senokot for constipation as directed and as needed Dulcolax (bisacodyl), take with full glass of water  Miralax (polyethylene glycol) once or twice a day as needed.  If you have tried all these things and are unable to have a bowel movement in the first 3-4 days after surgery call either your surgeon or your primary doctor.    If you experience loose stools or diarrhea, hold the medications until you stool forms back up.  If your symptoms do not get better within 1 week or if they get worse, check with your doctor.  If you experience "the worst abdominal pain ever" or develop nausea or vomiting, please contact the office immediately for further recommendations for treatment.   ITCHING:  If you experience itching with your medications, try taking only a single pain pill, or even half a pain pill at a time.  You can also use Benadryl over the counter for itching or also to help with sleep.   TED HOSE STOCKINGS:  Use stockings on both legs until for at least 2 weeks or as directed by physician office. They may be removed at night for sleeping.  MEDICATIONS:  See your medication summary on the "After Visit Summary" that nursing will review with you.  You may have some home medications which will be placed on hold until you complete the course of blood thinner medication.  It is important for you to complete the  blood thinner medication as prescribed.  PRECAUTIONS:  If you experience chest pain or shortness of breath - call 911 immediately for transfer to the hospital emergency department.   If you develop a fever greater that 101 F, purulent drainage from wound, increased redness or drainage from wound, foul odor from the wound/dressing, or calf pain - CONTACT YOUR SURGEON.                                                   FOLLOW-UP APPOINTMENTS:  If you do not already have a post-op appointment, please call the office for an appointment to be seen by your surgeon.  Guidelines for how soon to be seen are listed in your "After Visit Summary", but are typically between 1-4 weeks after surgery.  OTHER INSTRUCTIONS:   Knee Replacement:  Do not place pillow under knee, focus on keeping the knee straight while resting. CPM instructions: 0-90 degrees, 2 hours in the morning, 2 hours in the afternoon, and 2 hours in the evening. Place foam block, curve side up under heel at all times except when in CPM or when walking.  DO NOT modify, tear, cut, or change the foam block in any way.   DENTAL ANTIBIOTICS:  In most cases prophylactic antibiotics for Dental procdeures after total joint surgery are not necessary.  Exceptions are as follows:  1. History of prior total joint infection  2. Severely immunocompromised (Organ Transplant, cancer chemotherapy, Rheumatoid biologic meds such as [place])  3. Poorly controlled diabetes (A1C &gt; 8.0, blood glucose over 200)  If you have one of these conditions, contact your surgeon for an antibiotic prescription, prior to your dental procedure.   MAKE SURE YOU:  . Understand these instructions.  . Get help right away if you are not doing well or get worse.    Thank you for letting us be a  part of your medical care team.  It is a privilege we respect greatly.  We hope these instructions will help you stay on track for a fast and full recovery!

## 2020-03-23 NOTE — Discharge Summary (Signed)
Patient ID: Dwayne Lawrence MRN: [id number] DOB/AGE: 07/03/1956 64 y.o.  Admit date: 03/22/2020 Discharge date: 03/23/2020  Admission Diagnoses:  Principal Problem:   Unilateral primary osteoarthritis, left hip Active Problems:   Status post total replacement of left hip   Discharge Diagnoses:  Same  Past Medical History:  Diagnosis Date  . Arthritis   . DVT (deep venous thrombosis) ([place])    right PT and peroneal veins DVT 10/16/13   . Gout no recent flares  . Hypertension   . Prostate cancer ([place])   . Pulmonary embolus ([place])    right lung 2009; bilateral PE 10/15/13 post-prostatectomy  . Sleep apnea    does not use cpap, last sleep study more than 3 yrs ago    Surgeries: Procedure(s): LEFT TOTAL HIP ARTHROPLASTY ANTERIOR APPROACH on 03/22/2020   Consultants:   Discharged Condition: Improved  Hospital Course: Kano Lancour is an 64 y.o. male who was admitted 03/22/2020 for operative treatment ofUnilateral primary osteoarthritis, left hip. Patient has severe unremitting pain that affects sleep, daily activities, and work/hobbies. After pre-op clearance the patient was taken to the operating room on 03/22/2020 and underwent  Procedure(s): LEFT TOTAL HIP ARTHROPLASTY ANTERIOR APPROACH.    Patient was given perioperative antibiotics:  Anti-infectives (From admission, onward)   Start     Dose/Rate Route Frequency Ordered Stop   03/23/20 0600  ceFAZolin (ANCEF) 3 g in dextrose 5 % 50 mL IVPB     3 g 100 mL/hr over 30 Minutes Intravenous On call to O.R. 03/22/20 1204 03/22/20 1600   03/22/20 2130  ceFAZolin (ANCEF) IVPB 2g/100 mL premix     2 g 200 mL/hr over 30 Minutes Intravenous Every 6 hours 03/22/20 2029 03/23/20 0445       Patient was given sequential compression devices, early ambulation, and chemoprophylaxis to prevent DVT.  Patient benefited maximally from hospital stay and there were no complications.   He will continue his Lovenox bridge at home while he becomes  therapeutic on Coumadin (he has Lovenox at home).   Recent vital signs:  Patient Vitals for the past 24 hrs:  BP Temp Temp src Pulse Resp SpO2 Height Weight  03/23/20 0856 135/87 97.6 F (36.4 C) Oral 74 17 96 % -- --  03/23/20 0408 (!) 145/94 97.8 F (36.6 C) Oral 77 18 98 % -- --  03/23/20 0014 (!) 161/93 98.1 F (36.7 C) Oral 82 18 98 % -- --  03/22/20 2017 (!) 156/89 98 F (36.7 C) Oral 70 18 92 % -- --  03/22/20 2000 (!) 154/89 -- -- 64 11 99 % -- --  03/22/20 1945 (!) 147/85 (!) 97.4 F (36.3 C) -- 71 13 94 % -- --  03/22/20 1930 (!) 148/86 -- -- 73 (!) 8 99 % -- --  03/22/20 1920 (!) 148/86 -- -- 81 17 97 % -- --  03/22/20 1905 (!) 160/84 -- -- 78 12 99 % -- --  03/22/20 1835 (!) 162/95 -- -- 67 13 92 % -- --  03/22/20 1819 (!) 160/93 -- -- 74 13 97 % -- --  03/22/20 1805 (!) 151/97 -- -- 74 11 91 % -- --  03/22/20 1749 (!) 145/90 -- -- 74 11 100 % -- --  03/22/20 1745 -- (!) 97.5 F (36.4 C) -- -- -- -- -- --  03/22/20 1735 134/89 (!) 97.5 F (36.4 C) -- 80 11 99 % -- --  03/22/20 1218 132/83 98.2 F (36.8 C) Oral 64 17 97 %  6\' 1"  (1.854 m) 124.7 kg     Recent laboratory studies:  Recent Labs    03/23/20 0504  WBC 9.5  HGB 12.9*  HCT 40.7  PLT 123*  NA 135  K 4.4  CL 104  CO2 23  BUN 11  CREATININE 1.05  GLUCOSE 153*  INR 1.1  CALCIUM 8.8*     Discharge Medications:   Allergies as of 03/23/2020      Reactions   Iodine Hives      Medication List    STOP taking these medications   ibuprofen 200 MG tablet Commonly known as: ADVIL     TAKE these medications   allopurinol 100 MG tablet Commonly known as: ZYLOPRIM Take 100 mg by mouth daily.   amLODipine 5 MG tablet Commonly known as: NORVASC Take 5 mg by mouth every morning.   methocarbamol 500 MG tablet Commonly known as: ROBAXIN Take 1 tablet (500 mg total) by mouth every 6 (six) hours as needed for muscle spasms.   oxyCODONE 5 MG immediate release tablet Commonly known as: Oxy  IR/ROXICODONE Take 1-2 tablets (5-10 mg total) by mouth every 4 (four) hours as needed for moderate pain (pain score 4-6).   warfarin 10 MG tablet Commonly known as: COUMADIN Take 10 mg by mouth daily.            Durable Medical Equipment  (From admission, onward)         Start     Ordered   03/22/20 2030  DME 3 n 1  Once     03/22/20 2029   03/22/20 2030  DME Walker rolling  Once    Question Answer Comment  Walker: With 5 Inch Wheels   Patient needs a walker to treat with the following condition Status post total replacement of left hip      03/22/20 2029          Diagnostic Studies: DG Pelvis Portable  Result Date: 03/22/2020 CLINICAL DATA:  64 year old male status post left hip arthroplasty. EXAM: PORTABLE PELVIS 1-2 VIEWS COMPARISON:  Pelvic radiograph dated 02/29/2020. FINDINGS: There is a total left hip arthroplasty. The arthroplasty components appear intact and in anatomic alignment. There is no acute fracture or dislocation. There is moderate to severe arthritic changes of the right hip. The soft tissues are unremarkable. IMPRESSION: Status post total left hip arthroplasty.  No immediate complication. Electronically Signed   By: Anner Crete M.D.   On: 03/22/2020 19:40   DG C-Arm 1-60 Min  Result Date: 03/22/2020 CLINICAL DATA:  Anterior hip replacement EXAM: OPERATIVE LEFT HIP (WITH PELVIS IF PERFORMED) 2 VIEWS TECHNIQUE: Fluoroscopic spot image(s) were submitted for interpretation post-operatively. COMPARISON:  02/29/2020 FINDINGS: Seven fluoroscopic images are obtained during the performance of the procedure and are provided for interpretation only. Images demonstrate placement of a left hip arthroplasty in the expected position without signs of acute complication. FLUOROSCOPY TIME:  44 seconds IMPRESSION: 1. Left hip arthroplasty as above. Electronically Signed   By: Randa Ngo M.D.   On: 03/22/2020 17:12   DG HIP OPERATIVE UNILAT W OR W/O PELVIS  LEFT  Result Date: 03/22/2020 CLINICAL DATA:  Anterior hip replacement EXAM: OPERATIVE LEFT HIP (WITH PELVIS IF PERFORMED) 2 VIEWS TECHNIQUE: Fluoroscopic spot image(s) were submitted for interpretation post-operatively. COMPARISON:  02/29/2020 FINDINGS: Seven fluoroscopic images are obtained during the performance of the procedure and are provided for interpretation only. Images demonstrate placement of a left hip arthroplasty in the expected position without signs of  acute complication. FLUOROSCOPY TIME:  44 seconds IMPRESSION: 1. Left hip arthroplasty as above. Electronically Signed   By: Randa Ngo M.D.   On: 03/22/2020 17:12   XR HIP UNILAT W OR W/O PELVIS 1V LEFT  Result Date: 02/29/2020 A low AP pelvis and lateral of the left hip shows severe end-stage arthritis of the left hip.  There is cystic changes in the acetabulum and femoral head.  There is complete loss of superior lateral joint space.  There is flattening of the femoral head and periarticular osteophytes.   Disposition: Discharge disposition: 01-Home or Self Care         Follow-up Information    Mcarthur Rossetti, MD Follow up in 2 week(s).   Specialty: Orthopedic Surgery Contact information: [address] [place] [address] [phone]            Signed: Mcarthur Rossetti 03/23/2020, 12:12 PM

## 2020-03-23 NOTE — Progress Notes (Signed)
ANTICOAGULATION CONSULT NOTE - Follow Up Consult  Pharmacy Consult for Coumadin Indication: h/o DVT/PE  Allergies  Allergen Reactions  . Iodine Hives    Patient Measurements: Height: 6\' 1"  (185.4 cm) Weight: 124.7 kg (275 lb) IBW/kg (Calculated) : 79.9   Vital Signs: Temp: 97.8 F (36.6 C) (05/05 0408) Temp Source: Oral (05/05 0408) BP: 145/94 (05/05 0408) Pulse Rate: 77 (05/05 0408)  Labs: Recent Labs    03/22/20 1205 03/23/20 0504  HGB  --  12.9*  HCT  --  40.7  PLT  --  123*  APTT 26  --   LABPROT  --  13.6  INR  --  1.1  CREATININE  --  1.05    Estimated Creatinine Clearance: 97 mL/min (by C-G formula based on SCr of 1.05 mg/dL).   Assessment: Anticoag: Warfarin PTA (LD 4/27) for h/o DVT/PE. Hgb 12.9 post-op. INR 1.1 - PTA dose warfarin 10mg /d  Goal of Therapy:  INR 2-3 Monitor platelets by anticoagulation protocol: Yes   Plan:  Coumadin 12.5mg  po x 1 tonight Daily INR   Casilda Pickerill S. Alford Highland, PharmD, BCPS Clinical Staff Pharmacist [email or web address] Alford Highland, Myrical Andujo Stillinger 03/23/2020,8:13 AM

## 2020-03-23 NOTE — Op Note (Signed)
NAMEKOREDE, BITLER MEDICAL RECORD [id number] ACCOUNT [id number] DATE OF BIRTH:08/06/1956 FACILITY: [facility] LOCATION: [facility] PHYSICIAN:Nethra Mehlberg Kerry Fort, MD  OPERATIVE REPORT  DATE OF PROCEDURE:  03/22/2020  PREOPERATIVE DIAGNOSIS:  Primary osteoarthritis and degenerative joint disease, left hip.  POSTOPERATIVE DIAGNOSIS:  Primary osteoarthritis and degenerative joint disease, left hip.  PROCEDURE:  Left total hip arthroplasty through direct anterior approach.  IMPLANTS:  DePuy Sector Gription acetabular component size 56, size 36+4 neutral polyethylene liner, size 7 Actis femoral component with high offset, size 36+8.5 metal hip ball.  SURGEON:  Lind Guest. Ninfa Linden, MD  ASSISTANT:  Erskine Emery, PA-C  ANESTHESIA:  General.  ANTIBIOTICS:  3 g IV Ancef.  ESTIMATED BLOOD LOSS:  400 mL.  COMPLICATIONS:  None.  INDICATIONS:  The patient is a very pleasant 64 year old gentleman with debilitating arthritis involving both his hips with his left much worse than his right.  He is someone who is also on Coumadin, who actually needs to be bridged with Lovenox prior to  this surgery.  We did talk in detail about hip replacement surgery after he came to see me with debilitating hip pain and x-rays showing severe end-stage arthritis of the left hip.  At this point, his left hip pain is detrimentally affecting his  mobility, his quality of life and his activities light daily to the point he does wish to proceed with a total hip arthroplasty.  We talked in detail about the risk of acute blood loss anemia, nerve or vessel injury, fracture, infection, dislocation,  DVT, and implant failure.  We talked about our goals being decreased pain, improve mobility and overall improve quality of life.  He is someone who is quite solid with a BMI of 38.  DESCRIPTION OF PROCEDURE:  After informed consent was obtained and appropriate left hip was marked he was brought to the operating room where  general anesthesia was applied when he was on a stretcher.  He was then placed supine on the Hana fracture  table, the perineal post in place and both legs in line skeletal traction device and no traction applied.  His left operative hip was prepped and draped with DuraPrep and sterile drapes.  A time-out was called.  He was identified, correct patient,  correct left hip.  I then made an incision just inferior and posterior to the anterior superior iliac spine and carried this obliquely down the leg.  We dissected down tensor fascia lata muscle.  Tensor fascia was then divided longitudinally to proceed  with direct anterior approach to the hip.  We identified and cauterized circumflex vessels and identified the hip capsule, opened the hip capsule finding a significant joint effusion and significant periarticular osteophytes around the neck.  Next, he  does have a short neck and significant offset.  Placed a Cobra retractor around the medial and lateral femoral neck we then made our femoral neck cut with an oscillating saw just proximal to the lesser trochanter and completed this with an osteotome.  We  placed a corkscrew guide in the femoral head and removed the femoral head in its entirety and found a wide area devoid of cartilage.  We also found significant periarticular osteophytes around the acetabulum, which we removed as much as we could.  I  then placed a bent Hohmann over the medial acetabular rim and removed remnants of the acetabulum and other debris from the socket.  I then began reaming under direct visualization from a size 44 reamer in stepwise increments going up to  a size 55.  With  all reamers under direct visualization, the last reamer under direct fluoroscopy, so we could obtain our depth of reaming, our inclination and anteversion.  I then placed the real DePuy Sector Gription acetabular component size 56 and we went with a 36+0  neutral polyethylene liner since his preoperative leg  lengths were equal and he did not seem like we really medialized him.  Attention was then turned to the femur.  With the leg externally rotated to 120 degrees, extended and adducted, we were able to  place a Mueller retractor medially and Hohmann retractor above the greater trochanter, released lateral joint capsule and used a box-cutting osteotome to enter the femoral canal and a rongeur to lateralize then began broaching using the Actis broaching  system from a size 0 up to a size 7.  With a size 7 in place, we tried a high offset femoral neck and a 36+1.5 hip ball, reduced this in the acetabulum.  We felt like we needed just a little bit more leg length and offset.  We dislocated the hip and  removed the trial components.  I then placed the real Actis femoral component with high offset size 36 and as I put it down, I felt like it was just potentially slightly more anteverted.  I still went with a 36+5 metal hip ball, and reduced this in the  acetabulum and I just was not pleased with the stability of the hip.  I felt like we even needed a little more offset.  I also just analyzed it and going to a +8.5 hip ball I still did not feel like it would give enough offset, so we dislocated the hip  and removed the real +5 metal hip ball.  I then removed the acetabular liner, which is 36+0 and went with a 36+4 to give [place] more offset.  I placed the real 36+4 liner and went with a 36+8.5 metal hip ball, and reduced this in the acetabulum and at that  point, I was absolutely pleased with offset, range of motion, and stability assessed radiographically and mechanically.  We then irrigated the soft tissue with normal saline solution using pulse lavage.  There was not really much joint capsule to close,  so we closed the tensor fascia with #1 Vicryl followed by 0 Vicryl to close the deep tissue, 2-0 Vicryl was used to close the subcutaneous tissue, and interrupted staples to reapproximate the skin.  Xeroform and Aquacel  dressing was applied.  He was  taken off the Hana table, awakened, extubated, and taken to recovery room in stable condition.  All final counts were correct.  There were no complications noted.  Of note, Benita Stabile, PA-C, assisted during the entire case.  His assistance was crucial for  facilitating all aspects of this case.  CN/NUANCE  D:03/22/2020 T:03/23/2020 JOB:[id number]

## 2020-03-23 NOTE — Evaluation (Signed)
Physical Therapy Evaluation Patient Details Name: Dwayne [place] MRN: [id number] DOB: [birth year] Today's Date: 03/23/2020   History of Present Illness  Pt is a 64 y.o. M with significant PMH of HTN and prostatectomy who presents s/p left total hip arthroplasty, direct anterior approach.  Clinical Impression  Pt admitted s/p procedure listed above. Presents with expected post surgical pain, weakness, and gait abnormalities. Ambulating 120 feet with a walker at a min guard assist level. Initiated supine and seated therapeutic exercise program and written handout provided. Suspect steady progress with mobility. Will continue to progress as tolerated.     Follow Up Recommendations Outpatient PT;Supervision for mobility/OOB    Equipment Recommendations  3in1 (PT) (reports he has RW)   Recommendations for Other Services       Precautions / Restrictions Precautions Precautions: None Restrictions Weight Bearing Restrictions: Yes LLE Weight Bearing: Weight bearing as tolerated      Mobility  Bed Mobility Overal bed mobility: Needs Assistance Bed Mobility: Supine to Sit     Supine to sit: Min guard     General bed mobility comments: Increased time/effort, use of bed rail.  Transfers Overall transfer level: Needs assistance Equipment used: Rolling walker (2 wheeled) Transfers: Sit to/from Stand Sit to Stand: Min guard         General transfer comment: Cues for hand placement, min guard to rise  Ambulation/Gait Ambulation/Gait assistance: Min guard Gait Distance (Feet): 120 Feet Assistive device: Rolling walker (2 wheeled) Gait Pattern/deviations: Step-through pattern;Antalgic;Decreased stance time - left     General Gait Details: Cues for neutral left foot positioning  Stairs            Wheelchair Mobility    Modified Rankin (Stroke Patients Only)       Balance Overall balance assessment: Mild deficits observed, not formally tested                                           Pertinent Vitals/Pain Pain Assessment: Faces Faces Pain Scale: Hurts little more Pain Location: L hip Pain Descriptors / Indicators: Grimacing;Guarding;Sore Pain Intervention(s): Monitored during session    Home Living Family/patient expects to be discharged to:: Private residence Living Arrangements: Spouse/significant other Available Help at Discharge: Family Type of Home: House Home Access: Stairs to enter Entrance Stairs-Rails: None Entrance Stairs-Number of Steps: 1 Home Layout: Able to live on main level with bedroom/bathroom Home Equipment: Walker - 2 wheels;Cane - single point      Prior Function Level of Independence: Independent               Hand Dominance        Extremity/Trunk Assessment   Upper Extremity Assessment Upper Extremity Assessment: Overall WFL for tasks assessed    Lower Extremity Assessment Lower Extremity Assessment: LLE deficits/detail LLE Deficits / Details: Expected post op deficits. Able to perform quad set, limited heel slide    Cervical / Trunk Assessment Cervical / Trunk Assessment: Normal  Communication   Communication: No difficulties  Cognition Arousal/Alertness: Awake/alert Behavior During Therapy: WFL for tasks assessed/performed Overall Cognitive Status: Within Functional Limits for tasks assessed                                        General Comments      Exercises Total  Joint Exercises Quad Sets: Both;10 reps;Supine Heel Slides: Left;10 reps;Supine Hip ABduction/ADduction: Left;10 reps;Supine Long Arc Quad: Left;15 reps;Seated   Assessment/Plan    PT Assessment Patient needs continued PT services  PT Problem List Decreased strength;Decreased range of motion;Decreased activity tolerance;Decreased balance;Decreased mobility;Pain       PT Treatment Interventions DME instruction;Gait training;Stair training;Functional mobility training;Therapeutic  activities;Therapeutic exercise;Balance training;Patient/family education    PT Goals (Current goals can be found in the Care Plan section)  Acute Rehab PT Goals Patient Stated Goal: "go home." PT Goal Formulation: With patient Time For Goal Achievement: 04/06/20 Potential to Achieve Goals: Good    Frequency 7X/week   Barriers to discharge        Co-evaluation               AM-PAC PT "6 Clicks" Mobility  Outcome Measure Help needed turning from your back to your side while in a flat bed without using bedrails?: None Help needed moving from lying on your back to sitting on the side of a flat bed without using bedrails?: A Little Help needed moving to and from a bed to a chair (including a wheelchair)?: A Little Help needed standing up from a chair using your arms (e.g., wheelchair or bedside chair)?: A Little Help needed to walk in hospital room?: A Little Help needed climbing 3-5 steps with a railing? : A Little 6 Click Score: 19    End of Session   Activity Tolerance: Patient tolerated treatment well Patient left: in chair;with call bell/phone within reach;with chair alarm set Nurse Communication: Mobility status PT Visit Diagnosis: Pain;Difficulty in walking, not elsewhere classified (R26.2) Pain - Right/Left: Left Pain - part of body: Hip    Time: [id number] PT Time Calculation (min) (ACUTE ONLY): 31 min   Charges:   PT Evaluation $PT Eval Low Complexity: 1 Low PT Treatments $Gait Training: 8-22 mins          Wyona Almas, PT, DPT Acute Rehabilitation Services Pager [phone] Office [phone]   [name] 03/23/2020, 11:16 AM

## 2020-03-24 ENCOUNTER — Encounter: Payer: Self-pay | Admitting: *Deleted

## 2020-03-28 ENCOUNTER — Other Ambulatory Visit: Payer: Self-pay

## 2020-03-28 ENCOUNTER — Inpatient Hospital Stay (HOSPITAL_COMMUNITY): Payer: Medicare HMO

## 2020-03-28 ENCOUNTER — Inpatient Hospital Stay (HOSPITAL_COMMUNITY)
Admission: EM | Admit: 2020-03-28 | Discharge: 2020-04-05 | DRG: 336 | Disposition: A | Discharge status: Home Health | Payer: Medicare HMO | Attending: Internal Medicine | Admitting: Internal Medicine

## 2020-03-28 ENCOUNTER — Encounter (HOSPITAL_COMMUNITY): Payer: Self-pay | Admitting: Emergency Medicine

## 2020-03-28 ENCOUNTER — Emergency Department (HOSPITAL_COMMUNITY): Payer: Medicare HMO

## 2020-03-28 DIAGNOSIS — Z86711 Personal history of pulmonary embolism: Secondary | ICD-10-CM

## 2020-03-28 DIAGNOSIS — I10 Essential (primary) hypertension: Secondary | ICD-10-CM | POA: Diagnosis present

## 2020-03-28 DIAGNOSIS — Z86718 Personal history of other venous thrombosis and embolism: Secondary | ICD-10-CM | POA: Diagnosis not present

## 2020-03-28 DIAGNOSIS — R739 Hyperglycemia, unspecified: Secondary | ICD-10-CM | POA: Diagnosis present

## 2020-03-28 DIAGNOSIS — E860 Dehydration: Secondary | ICD-10-CM | POA: Diagnosis present

## 2020-03-28 DIAGNOSIS — M109 Gout, unspecified: Secondary | ICD-10-CM | POA: Diagnosis present

## 2020-03-28 DIAGNOSIS — R066 Hiccough: Secondary | ICD-10-CM | POA: Diagnosis present

## 2020-03-28 DIAGNOSIS — Z87891 Personal history of nicotine dependence: Secondary | ICD-10-CM | POA: Diagnosis not present

## 2020-03-28 DIAGNOSIS — Z8719 Personal history of other diseases of the digestive system: Secondary | ICD-10-CM | POA: Diagnosis not present

## 2020-03-28 DIAGNOSIS — Z7901 Long term (current) use of anticoagulants: Secondary | ICD-10-CM | POA: Diagnosis not present

## 2020-03-28 DIAGNOSIS — Z20822 Contact with and (suspected) exposure to covid-19: Secondary | ICD-10-CM | POA: Diagnosis present

## 2020-03-28 DIAGNOSIS — Z8546 Personal history of malignant neoplasm of prostate: Secondary | ICD-10-CM | POA: Diagnosis not present

## 2020-03-28 DIAGNOSIS — E870 Hyperosmolality and hypernatremia: Secondary | ICD-10-CM | POA: Diagnosis present

## 2020-03-28 DIAGNOSIS — K56609 Unspecified intestinal obstruction, unspecified as to partial versus complete obstruction: Secondary | ICD-10-CM

## 2020-03-28 DIAGNOSIS — D72828 Other elevated white blood cell count: Secondary | ICD-10-CM | POA: Diagnosis present

## 2020-03-28 DIAGNOSIS — D638 Anemia in other chronic diseases classified elsewhere: Secondary | ICD-10-CM | POA: Diagnosis present

## 2020-03-28 DIAGNOSIS — N179 Acute kidney failure, unspecified: Secondary | ICD-10-CM | POA: Diagnosis present

## 2020-03-28 DIAGNOSIS — E876 Hypokalemia: Secondary | ICD-10-CM | POA: Diagnosis not present

## 2020-03-28 DIAGNOSIS — Z96642 Presence of left artificial hip joint: Secondary | ICD-10-CM | POA: Diagnosis present

## 2020-03-28 DIAGNOSIS — K5652 Intestinal adhesions [bands] with complete obstruction: Secondary | ICD-10-CM | POA: Diagnosis present

## 2020-03-28 LAB — URINALYSIS, ROUTINE W REFLEX MICROSCOPIC
Bilirubin Urine: NEGATIVE
Glucose, UA: NEGATIVE mg/dL
Hgb urine dipstick: NEGATIVE
Ketones, ur: NEGATIVE mg/dL
Leukocytes,Ua: NEGATIVE
Nitrite: NEGATIVE
Protein, ur: NEGATIVE mg/dL
Specific Gravity, Urine: >1.046 — ABNORMAL HIGH (ref 1.005–1.030)
pH: 5 (ref 5.0–8.0)

## 2020-03-28 LAB — COMPREHENSIVE METABOLIC PANEL
ALT: 60 U/L — ABNORMAL HIGH (ref 0–44)
AST: 44 U/L — ABNORMAL HIGH (ref 15–41)
Albumin: 3.7 g/dL (ref 3.5–5.0)
Alkaline Phosphatase: 74 U/L (ref 38–126)
Anion gap: 13 (ref 5–15)
BUN: 27 mg/dL — ABNORMAL HIGH (ref 8–23)
CO2: 24 mmol/L (ref 22–32)
Calcium: 9.4 mg/dL (ref 8.9–10.3)
Chloride: 102 mmol/L (ref 98–111)
Creatinine, Ser: 1.36 mg/dL — ABNORMAL HIGH (ref 0.61–1.24)
GFR calc Af Amer: >60 mL/min (ref >60)
GFR calc non Af Amer: 54 mL/min — ABNORMAL LOW (ref >60)
Glucose, Bld: 146 mg/dL — ABNORMAL HIGH (ref 70–99)
Potassium: 3.8 mmol/L (ref 3.5–5.1)
Sodium: 139 mmol/L (ref 135–145)
Total Bilirubin: 1 mg/dL (ref 0.3–1.2)
Total Protein: 7.7 g/dL (ref 6.5–8.1)

## 2020-03-28 LAB — CBC
HCT: 39.7 % (ref 39.0–52.0)
Hemoglobin: 12.7 g/dL — ABNORMAL LOW (ref 13.0–17.0)
MCH: 29 pg (ref 26.0–34.0)
MCHC: 32 g/dL (ref 30.0–36.0)
MCV: 90.6 fL (ref 80.0–100.0)
Platelets: 208 10*3/uL (ref 150–400)
RBC: 4.38 MIL/uL (ref 4.22–5.81)
RDW: 14 % (ref 11.5–15.5)
WBC: 9.2 10*3/uL (ref 4.0–10.5)
nRBC: 0 % (ref 0.0–0.2)

## 2020-03-28 LAB — CBG MONITORING, ED
Glucose-Capillary: 145 mg/dL — ABNORMAL HIGH (ref 70–99)
Glucose-Capillary: 126 mg/dL — ABNORMAL HIGH (ref 70–99)

## 2020-03-28 LAB — LIPASE, BLOOD: Lipase: 56 U/L — ABNORMAL HIGH (ref 11–51)

## 2020-03-28 LAB — SARS CORONAVIRUS 2 BY RT PCR (HOSPITAL ORDER, PERFORMED IN ~~LOC~~ HOSPITAL LAB): SARS Coronavirus 2: NEGATIVE

## 2020-03-28 LAB — PROTIME-INR
INR: 1.5 — ABNORMAL HIGH (ref 0.8–1.2)
Prothrombin Time: 17.5 seconds — ABNORMAL HIGH (ref 11.4–15.2)

## 2020-03-28 LAB — HEMOGLOBIN A1C
Hgb A1c MFr Bld: 6 % — ABNORMAL HIGH (ref 4.8–5.6)
Mean Plasma Glucose: 125.5 mg/dL

## 2020-03-28 LAB — HEPARIN LEVEL (UNFRACTIONATED): Heparin Unfractionated: 0.82 IU/mL — ABNORMAL HIGH (ref 0.30–0.70)

## 2020-03-28 LAB — GLUCOSE, CAPILLARY: Glucose-Capillary: 120 mg/dL — ABNORMAL HIGH (ref 70–99)

## 2020-03-28 MED ORDER — AMLODIPINE BESYLATE 5 MG PO TABS: 5.0000 mg | ORAL_TABLET | Freq: Every morning | ORAL | Status: DC

## 2020-03-28 MED ORDER — DIATRIZOATE MEGLUMINE & SODIUM 66-10 % PO SOLN
90.0000 mL | Freq: Once | ORAL | Status: AC
  Administered 2020-03-28: 90 mL via NASOGASTRIC
  Filled 2020-03-28: qty 90

## 2020-03-28 MED ORDER — ACETAMINOPHEN 325 MG PO TABS: 650.0000 mg | ORAL_TABLET | Freq: Four times a day (QID) | ORAL | Status: DC | PRN

## 2020-03-28 MED ORDER — ONDANSETRON 4 MG PO TBDP
4.0000 mg | ORAL_TABLET | Freq: Once | ORAL | Status: DC
  Filled 2020-03-28: qty 1

## 2020-03-28 MED ORDER — HEPARIN (PORCINE) 25000 UT/250ML-% IV SOLN
1400.0000 [IU]/h | INTRAVENOUS | Status: AC
  Administered 2020-03-28: 2000 [IU]/h via INTRAVENOUS
  Administered 2020-03-29: 1800 [IU]/h via INTRAVENOUS
  Administered 2020-03-29: 1600 [IU]/h via INTRAVENOUS
  Administered 2020-03-30: 1400 [IU]/h via INTRAVENOUS
  Filled 2020-03-28 (×4): qty 250

## 2020-03-28 MED ORDER — HYDROMORPHONE HCL 1 MG/ML IJ SOLN
1.0000 mg | INTRAMUSCULAR | Status: DC | PRN
  Administered 2020-03-31 – 2020-04-01 (×2): 1 mg via INTRAVENOUS
  Filled 2020-03-28 (×2): qty 1

## 2020-03-28 MED ORDER — INSULIN ASPART 100 UNIT/ML ~~LOC~~ SOLN
0.0000 [IU] | SUBCUTANEOUS | Status: DC
  Administered 2020-03-28 – 2020-04-05 (×7): 1 [IU] via SUBCUTANEOUS

## 2020-03-28 MED ORDER — ACETAMINOPHEN 650 MG RE SUPP: 650.0000 mg | Freq: Four times a day (QID) | RECTAL | Status: DC | PRN

## 2020-03-28 MED ORDER — ONDANSETRON HCL 4 MG/2ML IJ SOLN
4.0000 mg | Freq: Four times a day (QID) | INTRAMUSCULAR | Status: DC | PRN
  Administered 2020-03-28 – 2020-03-29 (×2): 4 mg via INTRAVENOUS
  Filled 2020-03-28 (×2): qty 2

## 2020-03-28 MED ORDER — ONDANSETRON HCL 4 MG PO TABS: 4.0000 mg | ORAL_TABLET | Freq: Four times a day (QID) | ORAL | Status: DC | PRN

## 2020-03-28 MED ORDER — SODIUM CHLORIDE 0.9 % IV BOLUS
1000.0000 mL | Freq: Once | INTRAVENOUS | Status: AC
  Administered 2020-03-28: 1000 mL via INTRAVENOUS

## 2020-03-28 MED ORDER — ALLOPURINOL 100 MG PO TABS
100.0000 mg | ORAL_TABLET | Freq: Two times a day (BID) | ORAL | Status: DC
  Filled 2020-03-28 (×2): qty 1

## 2020-03-28 MED ORDER — SODIUM CHLORIDE 0.9% FLUSH: 3.0000 mL | Freq: Once | INTRAVENOUS | Status: DC

## 2020-03-28 MED ORDER — LACTATED RINGERS IV SOLN: INTRAVENOUS | Status: AC

## 2020-03-28 MED ORDER — IOHEXOL 300 MG/ML  SOLN
100.0000 mL | Freq: Once | INTRAMUSCULAR | Status: AC | PRN
  Administered 2020-03-28: 100 mL via INTRAVENOUS

## 2020-03-28 NOTE — ED Triage Notes (Signed)
Pt brought to ED by EMS from home for c/o Abd pain, nausea and vomiting since 03/24/2020, pt was dc 03/23/2020 after a hip replacement. Per EMS emesis are brown and dark in color. abd distended. BP 156/110, HR 100, SPO2 97% RA, CBG 170.

## 2020-03-28 NOTE — Progress Notes (Signed)
Patient ID: Dwayne [place], male   DOB: 01/23/1956, 64 y.o.   MRN: [id number] I have seen and examined the patient.  He is just 6 days out from a left total hip replacement.  His left hip is doing well.  He was mobilizing well following his surgery and only took narcotic pain medications for one day at home.  Is being admitted today for a SBO.  I did speak to his wife as well.  From an Ortho standpoint, he can be up mobilizing with physical therapy (WBAT, no hip precautions.

## 2020-03-28 NOTE — ED Notes (Signed)
RN clamped NG tube after administering Gastrografin per small bowel obstruction protocol.

## 2020-03-28 NOTE — Consult Note (Signed)
Dwayne [place] May 30, 1956  [id number].    Requesting MD: Dr. 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 Ninfa Linden. He was discharged on 5/5 and seemed to be doing well.  Unfortunately, the patient states he developed nausea later in the day after he was discharged.  He has had significant hiccups.  He last passed a small amount of flatus yesterday, but no BM since prior to his surgery last Tuesday.  He has had progressive N/V and abdominal distention.  He denies any fevers or chills.  He came to the ED today for evaluation due to persistent abdominal complaints.  He has normal labs except an elevation of his cr at 1.36.  His WBC is normal.  He has a CT scan that reveals a SBO with mild mesenteric swirling but no overt small bowel volvulus.  We have been asked to see him for further evaluation and recommendations.  ROS: ROS: Please see HPI, otherwise all other systems have been reviewed and are negative.  Not taking narcotics as he is not having pain from his arthroplasty.  History reviewed. No pertinent family history.  Past Medical History:  Diagnosis Date  . Arthritis   . DVT (deep venous thrombosis) ([place])    right PT and peroneal veins DVT 10/16/13   . Gout no recent flares  . Hypertension   . Prostate cancer ([place])   . Pulmonary embolus ([place])    right lung 2009; bilateral PE 10/15/13 post-prostatectomy  . Sleep apnea    does not use cpap, last sleep study more than 3 yrs ago    Past Surgical History:  Procedure Laterality Date  . APPENDECTOMY    . LYMPHADENECTOMY Bilateral 10/07/2013   Procedure: LYMPHADENECTOMY " BILATERAL PELVIC LYMPH NODE DISSECTION";  Surgeon: Bernestine Amass, MD;  Location: [facility];  Service: Urology;  Laterality: Bilateral;  . PROSTATE  BIOPSY  2013 and oct 2014  . ROBOT ASSISTED LAPAROSCOPIC RADICAL PROSTATECTOMY N/A 10/07/2013   Procedure: ROBOTIC ASSISTED LAPAROSCOPIC RADICAL PROSTATECTOMY;  Surgeon: Bernestine Amass, MD;  Location: [facility];  Service: Urology;  Laterality: N/A;  . TOTAL HIP ARTHROPLASTY Left 03/22/2020   Procedure: LEFT TOTAL HIP ARTHROPLASTY ANTERIOR APPROACH;  Surgeon: Mcarthur Rossetti, MD;  Location: [facility];  Service: Orthopedics;  Laterality: Left;    Social History:  reports that he quit smoking about 22 years ago. His smoking use included pipe. He quit smokeless tobacco use about 22 years ago.  His smokeless tobacco use included chew. He reports current alcohol use. He reports that he does not use drugs.  Allergies:  Allergies  Allergen Reactions  . Iodine Hives    (Not in a hospital admission)    Physical Exam: Blood pressure (!) 151/77, pulse 69, temperature 97.9 F (36.6 C), resp. rate 17, height 6\' 1"  (1.854 m), weight 124.7 kg, SpO2 98 %. General: pleasant, WD, WN, black male who is laying in bed in NAD HEENT: head is normocephalic, atraumatic.  Sclera are noninjected.  PERRL.  Ears and nose without any masses or lesions.  NGT just placed and about 400cc of bilious output.  Mouth is pink and dry Heart: regular, rate, and rhythm.  Normal s1,s2. No obvious murmurs, gallops, or rubs noted.  Palpable radial and pedal pulses bilaterally  Lungs: CTAB, no wheezes, rhonchi, or rales noted.  Respiratory effort nonlabored Abd: soft, some distention, mildly tender, but no guarding, rebounding or peritonitis, hypoactive BS, no masses, hernias, or organomegaly MS: all 4 extremities are symmetrical with no cyanosis, clubbing, or edema. Skin: warm and dry with no masses, lesions, or rashes Neuro: Cranial nerves 2-12 grossly intact, sensation is normal throughout Psych: A&Ox3 with an appropriate affect.   Results for orders placed or performed during the hospital encounter of 03/28/20 (from the past  48 hour(s))  Lipase, blood     Status: Abnormal   Collection Time: 03/28/20  5:46 AM  Result Value Ref Range   Lipase 56 (H) 11 - 51 U/L    Comment: Performed at [facility] [address], [place], [place] [address]  Comprehensive metabolic panel     Status: Abnormal   Collection Time: 03/28/20  5:46 AM  Result Value Ref Range   Sodium 139 135 - 145 mmol/L   Potassium 3.8 3.5 - 5.1 mmol/L   Chloride 102 98 - 111 mmol/L   CO2 24 22 - 32 mmol/L   Glucose, Bld 146 (H) 70 - 99 mg/dL    Comment: Glucose reference range applies only to samples taken after fasting for at least 8 hours.   BUN 27 (H) 8 - 23 mg/dL   Creatinine, Ser 1.36 (H) 0.61 - 1.24 mg/dL   Calcium 9.4 8.9 - 10.3 mg/dL   Total Protein 7.7 6.5 - 8.1 g/dL   Albumin 3.7 3.5 - 5.0 g/dL   AST 44 (H) 15 - 41 U/L   ALT 60 (H) 0 - 44 U/L   Alkaline Phosphatase 74 38 - 126 U/L   Total Bilirubin 1.0 0.3 - 1.2 mg/dL   GFR calc non Af Amer 54 (L) >60 mL/min   GFR calc Af Amer >60 >60 mL/min   Anion gap 13 5 - 15    Comment: Performed at [facility] [address] [place], [place] [address]  CBC     Status: Abnormal   Collection Time: 03/28/20  5:46 AM  Result Value Ref Range   WBC 9.2 4.0 - 10.5 K/uL   RBC 4.38 4.22 - 5.81 MIL/uL   Hemoglobin 12.7 (L) 13.0 - 17.0 g/dL   HCT 39.7 39.0 - 52.0 %   MCV 90.6 80.0 - 100.0 fL   MCH 29.0 26.0 - 34.0 pg   MCHC 32.0 30.0 - 36.0 g/dL   RDW 14.0 11.5 - 15.5 %   Platelets 208 150 - 400 K/uL   nRBC 0.0 0.0 - 0.2 %    Comment: Performed at [facility], [place] [address] [place], [place] [address]   CT Abdomen Pelvis W Contrast  Result Date: 03/28/2020 CLINICAL DATA:  Abdominal pain with nausea and vomiting. Status post recent total hip replacement EXAM: CT ABDOMEN AND PELVIS WITH CONTRAST TECHNIQUE: Multidetector CT imaging of the abdomen and pelvis was performed using the standard protocol following bolus administration of intravenous contrast. CONTRAST:  18mL  OMNIPAQUE IOHEXOL 300 MG/ML  SOLN COMPARISON:  July 15, 2017 FINDINGS: Lower chest: There is atelectatic change in the lung bases. No airspace consolidation seen in the lung bases currently. Hepatobiliary: No focal liver lesions are appreciable. Gallbladder wall is not appreciably thickened. There is no biliary duct dilatation. Pancreas: There is no pancreatic mass or inflammatory focus. Spleen: No splenic lesions are evident. Adrenals/Urinary Tract: Adrenals bilaterally appear normal. Kidneys bilaterally show no evident mass  or hydronephrosis on either side. There is no evident renal or ureteral calculus on either side. Urinary bladder is midline with wall thickness within normal limits. Stomach/Bowel: There is bowel dilatation to the mid jejunal level where there is a rather abrupt transition zone consistent with bowel obstruction. The vessels in the mid abdominal mesentery near this point obstruction have a somewhat swirl like appearance which raises concern for possible underlying internal hernia. There is no free air or portal venous air. The more distal loops of small bowel have a normal caliber. The terminal ileum appears normal. No free air or portal venous air is evident. Vascular/Lymphatic: There is no abdominal aortic aneurysm. There are scattered foci of atherosclerotic calcification in the distal aorta and common iliac arteries. Major venous structures appear patent. There is no evident adenopathy in the abdomen or pelvis. Reproductive: Prostate surgically absent. A few calcifications are noted in the periprostatic region. No evident pelvic mass. Other: Appendix absent. No periappendiceal region inflammation. No abscess or ascites evident in the abdomen or pelvis. Musculoskeletal: Patient is status post recent total hip replacement on the left with prosthetic components well-seated. There is soft tissue air and hematoma immediately anterior and lateral to the left tensor fascia lata muscle. This area  hematoma measures 6.4 x 2.6 cm. There is soft tissue stranding in this area. These changes are felt to be secondary to the recent total hip replacement. Elsewhere, there is mild degenerative change in the lumbar spine. No blastic or lytic bone lesions. No intramuscular lesions beyond mild left thigh muscle edema secondary to recent hip surgery. IMPRESSION: 1. Small bowel obstruction with a rather abrupt transition zone in the mid jejunal level slightly to the right of midline in the mid abdomen. No free air or portal venous air. 2. Somewhat swirl like appearance of the mid abdominal mesentery and vessels in the mid abdomen near the site of bowel obstruction. Suspect internal hernia in this area. No frank bowel volvulus is evident by CT on this examination. Bowel distal to the obstruction in the mid jejunal level appears unremarkable. No free air or portal venous air. 3.  Prostate and appendix absent. 4. Hematoma with air in the anterior upper thigh region on the left consistent with recent total hip replacement. Total hip replacement on the left noted with prosthetic components well-seated. No blastic or lytic bone lesions. Electronically Signed   By: Lowella Grip III M.D.   On: 03/28/2020 09:30      Assessment/Plan HTN AKI - likely from dehydration H/o prostate cancer H/o DVT/PE - on coumadin Gout 1 week s/p L THA  SBO The patient likely started out at with an ileus post op, but secondary to his prior surgical history, he appears to have a SBO at this time with an abrupt transition zone likely secondary to adhesive disease.  He does have a little mesenteric swirling, but no signs of bowel volvulus or symptoms c/w ischemia.  He has had an NGT placed and he will be admitted and started on the SBO protocol.  I have explained this protocol to the patient and his wife and they understand.  If the patient worsens or fails to progress, he may require surgery.  His coumadin is being held.  He can be  started on iv heparin from our standpoint.  He could probably have therapeutic lovenox, but this would take longer to potentially wear off if he need something more urgent.  I have also let Dr. Ninfa Linden know that he is  here per patient request.  We will continue to follow the patient with you.   FEN - NPO/NGT VTE - coumadin on hold, ok for heparin ID - none needed  Henreitta Cea, [facility] 03/28/2020, 11:18 AM Please see Amion for pager number during day hours 7:00am-4:30pm or 7:00am -11:30am on weekends

## 2020-03-28 NOTE — ED Notes (Signed)
Pt has had hiccups intermittently for 5 days-- unablwe to get rid of them, along with abd pain 4/10 pain.

## 2020-03-28 NOTE — ED Notes (Signed)
RN reconnected NG to low intermittent suction.

## 2020-03-28 NOTE — ED Notes (Signed)
RN attempted to call report to 4E

## 2020-03-28 NOTE — H&P (Signed)
Date: 03/28/2020               Patient Name:  Dwayne Lawrence MRN: [id number]  DOB: [birth year] Age / Sex: 64 y.o., male   PCP: [name]., MD         Medical Service: Internal Medicine Teaching Service         Attending Physician: Dr. Sid Falcon, MD    First Contact: Dr. Darrick Meigs  Pager: [phone]  Second Contact: Dr. Sherry Ruffing Pager: [phone]       After Hours (After 5p/  First Contact Pager: [phone]  weekends / holidays): Second Contact Pager: [phone]   Chief Complaint: Abdominal pain   History of Present Illness: Dwayne Lawrence is a 64 y.o male with a history of prostate cancer s/p resection, HTN, gout, prior DVT/PE on warfarin, and arthritis who recently underwent L total hip arthroplasty on 03/22/20 who presented to the ED with progressive N/V and abdominal pain since shortly after discharge on 03/23/20. History was obtained via the patient, the patient's wife, and through chart review.   Patient was discharged on 5/5 in stable condition. Unfortunately shortly after getting home he developed nausea and abdominal distention. His symptoms progressed and he developed N/V and aching abdominal pain. He then developed hiccups. He has not had a bowel movement since 5/4 and last passed flatus on 5/9. He has not been taking the pain medication since discharge. He has had a prior episode of ileus after he had surgery for a ruptured appendix. Aside from his appendectomy he has had no other intrabdominal surgeries. His pain is manageable currently.   He denies fevers/chills, headaches, visual changes, sinus congestion, sore throat, SHOB, chest pain, palpitations, cough, myalgias, arthralgias, new rash. He has been taking all his medications as prescribed, including his warfarin.   Meds:  Current Meds  Medication Sig  . acetaminophen (TYLENOL) 325 MG tablet Take 650 mg by mouth every 6 (six) hours as needed for mild pain.  Marland Kitchen allopurinol (ZYLOPRIM) 100 MG tablet Take 100 mg by mouth 2  (two) times daily.   Marland Kitchen amLODipine (NORVASC) 5 MG tablet Take 5 mg by mouth every morning.  . warfarin (COUMADIN) 10 MG tablet Take 10 mg by mouth daily.   Allergies: Allergies as of 03/28/2020 - Review Complete 03/28/2020  Allergen Reaction Noted  . Iodine Hives 03/22/2020   Past Medical History:  Diagnosis Date  . Arthritis   . DVT (deep venous thrombosis) ([place])    right PT and peroneal veins DVT 10/16/13   . Gout no recent flares  . Hypertension   . Prostate cancer ([place])   . Pulmonary embolus ([place])    right lung 2009; bilateral PE 10/15/13 post-prostatectomy  . Sleep apnea    does not use cpap, last sleep study more than 3 yrs ago   History reviewed. No pertinent family history.  Social History   Socioeconomic History  . Marital status: Married    Spouse name: Not on file  . Number of children: Not on file  . Years of education: Not on file  . Highest education level: Not on file  Occupational History  . Not on file  Tobacco Use  . Smoking status: Former Smoker    Types: Pipe    Quit date: 11/19/1997    Years since quitting: 22.3  . Smokeless tobacco: Former Systems developer    Types: [place] date: 11/19/1997  Substance and Sexual Activity  . Alcohol use: Yes  Comment: 2 drinks per day  . Drug use: No  . Sexual activity: Not on file  Other Topics Concern  . Not on file  Social History Narrative  . Not on file   Social Determinants of Health   Financial Resource Strain:   . Difficulty of Paying Living Expenses:   Food Insecurity:   . Worried About Charity fundraiser in the Last Year:   . Arboriculturist in the Last Year:   Transportation Needs:   . Film/video editor (Medical):   Marland Kitchen Lack of Transportation (Non-Medical):   Physical Activity:   . Days of Exercise per Week:   . Minutes of Exercise per Session:   Stress:   . Feeling of Stress :   Social Connections:   . Frequency of Communication with Friends and Family:   . Frequency of Social Gatherings  with Friends and Family:   . Attends Religious Services:   . Active Member of Clubs or Organizations:   . Attends Archivist Meetings:   Marland Kitchen Marital Status:   Intimate Partner Violence:   . Fear of Current or Ex-Partner:   . Emotionally Abused:   Marland Kitchen Physically Abused:   . Sexually Abused:    Review of Systems: A complete ROS was negative except as per HPI.   Physical Exam: Blood pressure (!) 151/77, pulse 69, temperature 97.9 F (36.6 C), resp. rate 17, height 6\' 1"  (1.854 m), weight 124.7 kg, SpO2 98 %.  General: Obese male in no acute distress HENT: Normocephalic, atraumatic, moist mucus membranes, NG tube in palce Pulm: Good air movement with no wheezing or crackles  CV: RRR, no murmurs, no rubs  Abdomen: No bowel sounds, soft, distended, minimal tenderness to palpation  Extremities: Pulses palpable in all extremities, no LE edema  Skin: Warm and dry  Neuro: Alert and oriented x 3  CT Abdomen / Pelvis with Contrast  1. Small bowel obstruction with a rather abrupt transition zone in the mid jejunal level slightly to the right of midline in the mid abdomen. No free air or portal venous air.  2. Somewhat swirl like appearance of the mid abdominal mesentery and vessels in the mid abdomen near the site of bowel obstruction. Suspect internal hernia in this area. No frank bowel volvulus is evident by CT on this examination. Bowel distal to the obstruction in the mid jejunal level appears unremarkable. No free air or portal venous air.  3.  Prostate and appendix absent.  4. Hematoma with air in the anterior upper thigh region on the left consistent with recent total hip replacement. Total hip replacement on the left noted with prosthetic components well-seated. No blastic or lytic bone lesions.  Assessment & Plan by Problem: Active Problems:   SBO (small bowel obstruction) (HCC)  [name] is a 64 y.o male with a history of prostate cancer s/p resection,  HTN, gout, prior DVT/PE on warfarin, and arthritis who recently underwent L total hip arthroplasty on 03/22/20 who presented to the ED with progressive N/V and abdominal pain since shortly after discharge on 03/23/20. CT abdomen was consistent with an SBO. The patient was subsequnetly admitted for further evaluation/management.   SBO  - Progressive N/V, abdominal distention, and abdominal pain. CT abdomen concerning for abrupt transition and possible internal hernia.  - NG tube in place for symptom management  - SBO protocol per surgery  - No indication for abx currently  - Zofran for nausea  - IV dilaudid  for pain control  - Start on IV heparin for prior VTE/PE incase surgery is warranted - Appreciate surgery team recs  Prior VTE/PE - Occurred in 2014 after prostate surgery  - Has been on Warfarin  - Hold warfarin and check INR  - Start IV heparin in case patient needs to go to surgery   AKI  - Baseline creatinine around 1.0. Elevated to 1.36 on admission  - Hemodynamically mediated given history   - Start IVF (LR at 125 cc/hr for 24 hours) - Repeat BMP tomorrow and hold nephrotoxic medications   Hyperglycemia  - Check A1c  - SSI every 4 hours while NPO, sensitive   HTN  - Outpatient regimen with amlodipine  - Hold for now.  Gout  - Outpatient regimen with allopurinol   - Hold for now.  Diet: NPO  VTE ppx: Full dose IV heparin  CODE STATUS: Full Code  Dispo: Admit patient to Inpatient with expected length of stay greater than 2 midnights.  SignedIna Homes, MD 03/28/2020, 11:54 AM  Pager: [phone]

## 2020-03-28 NOTE — ED Provider Notes (Signed)
[facility] EMERGENCY DEPARTMENT Provider Note  CSN: [id number] Arrival date & time: 03/28/20 [id number]    History Chief Complaint  Patient presents with  . Abdominal Pain    HPI  Dwayne Lawrence is a 64 y.o. male presents for evaluation of abdominal pain. Patient had a total hip replacement 1 week ago, went home 5 days ago. He report since he has been home, his pain has been well controlled and he has not needed to take any narcotic pain meds, but he has had persistent nausea, vomiting and abdominal pain/distension. He has not had a bowel movement since he got home. He reports limited PO intake since he got home. Denies fever, no hip pain, wife reports incision has been clean and she has been doing dressing changes.    Past Medical History:  Diagnosis Date  . Arthritis   . DVT (deep venous thrombosis) ([place])    right PT and peroneal veins DVT 10/16/13   . Gout no recent flares  . Hypertension   . Prostate cancer ([place])   . Pulmonary embolus ([place])    right lung 2009; bilateral PE 10/15/13 post-prostatectomy  . Sleep apnea    does not use cpap, last sleep study more than 3 yrs ago    Past Surgical History:  Procedure Laterality Date  . APPENDECTOMY    . LYMPHADENECTOMY Bilateral 10/07/2013   Procedure: LYMPHADENECTOMY " BILATERAL PELVIC LYMPH NODE DISSECTION";  Surgeon: Bernestine Amass, MD;  Location: [facility];  Service: Urology;  Laterality: Bilateral;  . PROSTATE BIOPSY  2013 and oct 2014  . ROBOT ASSISTED LAPAROSCOPIC RADICAL PROSTATECTOMY N/A 10/07/2013   Procedure: ROBOTIC ASSISTED LAPAROSCOPIC RADICAL PROSTATECTOMY;  Surgeon: Bernestine Amass, MD;  Location: [facility];  Service: Urology;  Laterality: N/A;  . TOTAL HIP ARTHROPLASTY Left 03/22/2020   Procedure: LEFT TOTAL HIP ARTHROPLASTY ANTERIOR APPROACH;  Surgeon: Mcarthur Rossetti, MD;  Location: [place];  Service: Orthopedics;  Laterality: Left;    History reviewed. No pertinent family history.  Social History   Tobacco Use   . Smoking status: Former Smoker    Types: Pipe    Quit date: 11/19/1997    Years since quitting: 22.3  . Smokeless tobacco: Former Systems developer    Types: [place] date: 11/19/1997  Substance Use Topics  . Alcohol use: Yes    Comment: 2 drinks per day  . Drug use: No     Home Medications Prior to Admission medications   Medication Sig Start Date End Date Taking? Authorizing Provider  acetaminophen (TYLENOL) 325 MG tablet Take 650 mg by mouth every 6 (six) hours as needed for mild pain.   Yes [provider]  allopurinol (ZYLOPRIM) 100 MG tablet Take 100 mg by mouth 2 (two) times daily.  02/02/20  Yes [provider]  amLODipine (NORVASC) 5 MG tablet Take 5 mg by mouth every morning.   Yes [provider]  warfarin (COUMADIN) 10 MG tablet Take 10 mg by mouth daily. 12/23/19  Yes [provider]  methocarbamol (ROBAXIN) 500 MG tablet Take 1 tablet (500 mg total) by mouth every 6 (six) hours as needed for muscle spasms. Patient not taking: Reported on 03/28/2020 03/23/20   Mcarthur Rossetti, MD  oxyCODONE (OXY IR/ROXICODONE) 5 MG immediate release tablet Take 1-2 tablets (5-10 mg total) by mouth every 4 (four) hours as needed for moderate pain (pain score 4-6). Patient not taking: Reported on 03/28/2020 03/23/20   Mcarthur Rossetti, MD  Allergies    Iodine   Review of Systems   Review of Systems  Constitutional: Negative for fever.  HENT: Negative for congestion and sore throat.   Respiratory: Negative for cough and shortness of breath.   Cardiovascular: Negative for chest pain.  Gastrointestinal: Positive for abdominal pain, nausea and vomiting. Negative for diarrhea.  Genitourinary: Negative for dysuria.  Musculoskeletal: Negative for myalgias.  Skin: Negative for rash.  Neurological: Negative for headaches.  Psychiatric/Behavioral: Negative for behavioral problems.     Physical Exam BP (!) 151/77   Pulse 69   Temp 97.9 F (36.6 C)    Resp 17   Ht 6\' 1"  (1.854 m)   Wt 124.7 kg   SpO2 98%   BMI 36.27 kg/m   Physical Exam Vitals and nursing note reviewed.  Constitutional:      Appearance: Normal appearance.  HENT:     Head: Normocephalic and atraumatic.     Nose: Nose normal.     Mouth/Throat:     Mouth: Mucous membranes are moist.  Eyes:     Extraocular Movements: Extraocular movements intact.     Conjunctiva/sclera: Conjunctivae normal.  Cardiovascular:     Rate and Rhythm: Normal rate.  Pulmonary:     Effort: Pulmonary effort is normal.     Breath sounds: Normal breath sounds.  Abdominal:     General: Bowel sounds are decreased. There is distension.     Palpations: Abdomen is soft.     Tenderness: There is generalized abdominal tenderness. There is no guarding.  Musculoskeletal:        General: No swelling. Normal range of motion.     Cervical back: Neck supple.  Skin:    General: Skin is warm and dry.  Neurological:     General: No focal deficit present.     Mental Status: He is alert.  Psychiatric:        Mood and Affect: Mood normal.      ED Results / Procedures / Treatments   Labs (all labs ordered are listed, but only abnormal results are displayed) Labs Reviewed  LIPASE, BLOOD - Abnormal; Notable for the following components:      Result Value   Lipase 56 (*)    All other components within normal limits  COMPREHENSIVE METABOLIC PANEL - Abnormal; Notable for the following components:   Glucose, Bld 146 (*)    BUN 27 (*)    Creatinine, Ser 1.36 (*)    AST 44 (*)    ALT 60 (*)    GFR calc non Af Amer 54 (*)    All other components within normal limits  CBC - Abnormal; Notable for the following components:   Hemoglobin 12.7 (*)    All other components within normal limits  PROTIME-INR - Abnormal; Notable for the following components:   Prothrombin Time 17.5 (*)    INR 1.5 (*)    All other components within normal limits  CBG MONITORING, ED - Abnormal; Notable for the following  components:   Glucose-Capillary 145 (*)    All other components within normal limits  CBG MONITORING, ED - Abnormal; Notable for the following components:   Glucose-Capillary 126 (*)    All other components within normal limits  SARS CORONAVIRUS 2 BY RT PCR (HOSPITAL ORDER, [place] [facility])  URINALYSIS, ROUTINE W REFLEX MICROSCOPIC  HIV ANTIBODY (ROUTINE TESTING W REFLEX)  HEMOGLOBIN A1C  HEPARIN LEVEL (UNFRACTIONATED)  CBC  PROTIME-INR  COMPREHENSIVE METABOLIC PANEL  HEPARIN LEVEL (UNFRACTIONATED)    EKG None   Radiology DG Abdomen 1 View  Result Date: 03/28/2020 CLINICAL DATA:  Enteric tube placement. EXAM: ABDOMEN - 1 VIEW COMPARISON:  CT abdomen pelvis from same day. FINDINGS: Enteric tube in the stomach. Multiple prominently dilated loops of proximal small bowel again noted. No radio-opaque calculi or other significant radiographic abnormality are seen. No acute osseous abnormality. IMPRESSION: 1. Appropriately positioned enteric tube. 2. Unchanged small bowel obstruction. Electronically Signed   By: Titus Dubin M.D.   On: 03/28/2020 11:41   CT Abdomen Pelvis W Contrast  Result Date: 03/28/2020 CLINICAL DATA:  Abdominal pain with nausea and vomiting. Status post recent total hip replacement EXAM: CT ABDOMEN AND PELVIS WITH CONTRAST TECHNIQUE: Multidetector CT imaging of the abdomen and pelvis was performed using the standard protocol following bolus administration of intravenous contrast. CONTRAST:  183mL OMNIPAQUE IOHEXOL 300 MG/ML  SOLN COMPARISON:  July 15, 2017 FINDINGS: Lower chest: There is atelectatic change in the lung bases. No airspace consolidation seen in the lung bases currently. Hepatobiliary: No focal liver lesions are appreciable. Gallbladder wall is not appreciably thickened. There is no biliary duct dilatation. Pancreas: There is no pancreatic mass or inflammatory focus. Spleen: No splenic lesions are evident. Adrenals/Urinary Tract:  Adrenals bilaterally appear normal. Kidneys bilaterally show no evident mass or hydronephrosis on either side. There is no evident renal or ureteral calculus on either side. Urinary bladder is midline with wall thickness within normal limits. Stomach/Bowel: There is bowel dilatation to the mid jejunal level where there is a rather abrupt transition zone consistent with bowel obstruction. The vessels in the mid abdominal mesentery near this point obstruction have a somewhat swirl like appearance which raises concern for possible underlying internal hernia. There is no free air or portal venous air. The more distal loops of small bowel have a normal caliber. The terminal ileum appears normal. No free air or portal venous air is evident. Vascular/Lymphatic: There is no abdominal aortic aneurysm. There are scattered foci of atherosclerotic calcification in the distal aorta and common iliac arteries. Major venous structures appear patent. There is no evident adenopathy in the abdomen or pelvis. Reproductive: Prostate surgically absent. A few calcifications are noted in the periprostatic region. No evident pelvic mass. Other: Appendix absent. No periappendiceal region inflammation. No abscess or ascites evident in the abdomen or pelvis. Musculoskeletal: Patient is status post recent total hip replacement on the left with prosthetic components well-seated. There is soft tissue air and hematoma immediately anterior and lateral to the left tensor fascia lata muscle. This area hematoma measures 6.4 x 2.6 cm. There is soft tissue stranding in this area. These changes are felt to be secondary to the recent total hip replacement. Elsewhere, there is mild degenerative change in the lumbar spine. No blastic or lytic bone lesions. No intramuscular lesions beyond mild left thigh muscle edema secondary to recent hip surgery. IMPRESSION: 1. Small bowel obstruction with a rather abrupt transition zone in the mid jejunal level slightly  to the right of midline in the mid abdomen. No free air or portal venous air. 2. Somewhat swirl like appearance of the mid abdominal mesentery and vessels in the mid abdomen near the site of bowel obstruction. Suspect internal hernia in this area. No frank bowel volvulus is evident by CT on this examination. Bowel distal to the obstruction in the mid jejunal level appears unremarkable. No free air or portal venous air. 3.  Prostate and appendix absent. 4. Hematoma with  air in the anterior upper thigh region on the left consistent with recent total hip replacement. Total hip replacement on the left noted with prosthetic components well-seated. No blastic or lytic bone lesions. Electronically Signed   By: Lowella Grip III M.D.   On: 03/28/2020 09:30    Procedures Procedures  Medications Ordered in the ED Medications  sodium chloride flush (NS) 0.9 % injection 3 mL (3 mLs Intravenous Not Given 03/28/20 0532)  ondansetron (ZOFRAN-ODT) disintegrating tablet 4 mg (0 mg Oral Hold 03/28/20 0537)  acetaminophen (TYLENOL) tablet 650 mg (has no administration in time range)    Or  acetaminophen (TYLENOL) suppository 650 mg (has no administration in time range)  ondansetron (ZOFRAN) tablet 4 mg ( Oral See Alternative 03/28/20 1504)    Or  ondansetron (ZOFRAN) injection 4 mg (4 mg Intravenous Given 03/28/20 1504)  HYDROmorphone (DILAUDID) injection 1 mg (has no administration in time range)  lactated ringers infusion ( Intravenous Rate/Dose Verify 03/28/20 1317)  insulin aspart (novoLOG) injection 0-9 Units (1 Units Subcutaneous Given 03/28/20 1605)  heparin ADULT infusion 100 units/mL (25000 units/296mL sodium chloride 0.45%) (2,000 Units/hr Intravenous Rate/Dose Verify 03/28/20 1322)  sodium chloride 0.9 % bolus 1,000 mL (0 mLs Intravenous Stopped 03/28/20 1305)  iohexol (OMNIPAQUE) 300 MG/ML solution 100 mL (100 mLs Intravenous Contrast Given 03/28/20 0901)  diatrizoate meglumine-sodium (GASTROGRAFIN) 66-10  % solution 90 mL (90 mLs Per NG tube Given 03/28/20 1330)     ED Course  I have reviewed the triage vital signs and the nursing notes.  Pertinent labs & imaging results that were available during my care of the patient were reviewed by me and considered in my medical decision making (see chart for details).  Clinical Course as of Mar 29 1855  Mon Mar 28, 2020  0958 Patient with recent hip surgery has since has several days of vomiting and abdominal distension. Labs reviewed, mild dehydration but normal WBC. CT shows SBO with transition point concerning for internal hernia. Discussed these results with patient. Will consult Gen Surg. NG tube ordered. Patient reports pain is minimal. Does not want pain meds at this time.    [CS]  [id number] Spoke with Dr. Kieth Brightly, Surgery who will evaluate the patient but agrees with plan for NG Tube. Patient needs INR checked as he is on coumadin for PE/DVT. She requests medicine to admit.    [CS]  [id number] Discussed with Dr. Tarri Abernethy, IM Resident who will evaluate for admission.    [CS]    Clinical Course User Index [CS] Truddie Hidden, MD    MDM Rules/Calculators/A&P MDM  Final Clinical Impression(s) / ED Diagnoses Final diagnoses:  SBO (small bowel obstruction) [facility])    Rx / DC Orders ED Discharge Orders    None       Truddie Hidden, MD 03/28/20 1857

## 2020-03-28 NOTE — Progress Notes (Signed)
Pt received from ED. VSS. CHG complete. NG tube to low intermittent suction. Pt and family oriented to room and unit. Will continue to monitor.  Clyde Canterbury, RN

## 2020-03-28 NOTE — Progress Notes (Signed)
ANTICOAGULATION CONSULT NOTE - Initial Consult  Pharmacy Consult for heparin Indication: hx VTE  Allergies  Allergen Reactions  . Iodine Hives    Patient Measurements: Height: 6\' 1"  (185.4 cm) Weight: 124.7 kg (274 lb 14.6 oz) IBW/kg (Calculated) : 79.9 Heparin Dosing Weight: 107.3kg  Vital Signs: Temp: 97.9 F (36.6 C) (05/10 0525) BP: 151/77 (05/10 0525) Pulse Rate: 69 (05/10 0525)  Labs: Recent Labs    03/28/20 0546  HGB 12.7*  HCT 39.7  PLT 208  CREATININE 1.36*    Estimated Creatinine Clearance: 74.9 mL/min (A) (by C-G formula based on SCr of 1.36 mg/dL (H)).   Medical History: Past Medical History:  Diagnosis Date  . Arthritis   . DVT (deep venous thrombosis) ([place])    right PT and peroneal veins DVT 10/16/13   . Gout no recent flares  . Hypertension   . Prostate cancer ([place])   . Pulmonary embolus ([place])    right lung 2009; bilateral PE 10/15/13 post-prostatectomy  . Sleep apnea    does not use cpap, last sleep study more than 3 yrs ago    Medications:  Infusions:  . heparin    . lactated ringers      Assessment: 70 yom presented to the ED with abdominal pain. Found to have a SBO. He is on chronic warfarin for history for VTE. INR is subtherapeutic at 1.5. Baseline CBC is ok and no bleeding noted. Of note, he had surgery on 5/4.   Goal of Therapy:  Heparin level 0.3-0.7 units/ml Monitor platelets by anticoagulation protocol: Yes   Plan:  Heparin gtt 2000 units/hr Check an 8 hr heparin level Daily heparin level and CBC  Nyrie Sigal, Rande Lawman 03/28/2020,11:12 AM

## 2020-03-28 NOTE — Progress Notes (Signed)
ANTICOAGULATION CONSULT NOTE - Initial Consult  Pharmacy Consult for heparin Indication: hx VTE  Allergies  Allergen Reactions  . Iodine Hives    Patient Measurements: Height: 6\' 1"  (185.4 cm) Weight: 124.7 kg (274 lb 14.6 oz) IBW/kg (Calculated) : 79.9 Heparin Dosing Weight: 107.3kg  Vital Signs: Temp: 98.6 F (37 C) (05/10 2013) Temp Source: Oral (05/10 2013) BP: 142/95 (05/10 2013) Pulse Rate: 88 (05/10 2013)  Labs: Recent Labs    03/28/20 0546 03/28/20 1142 03/28/20 2102  HGB 12.7*  --   --   HCT 39.7  --   --   PLT 208  --   --   LABPROT  --  17.5*  --   INR  --  1.5*  --   HEPARINUNFRC  --   --  0.82*  CREATININE 1.36*  --   --     Estimated Creatinine Clearance: 74.9 mL/min (A) (by C-G formula based on SCr of 1.36 mg/dL (H)).   Medical History: Past Medical History:  Diagnosis Date  . Arthritis   . DVT (deep venous thrombosis) ([place])    right PT and peroneal veins DVT 10/16/13   . Gout no recent flares  . Hypertension   . Prostate cancer ([place])   . Pulmonary embolus ([place])    right lung 2009; bilateral PE 10/15/13 post-prostatectomy  . Sleep apnea    does not use cpap, last sleep study more than 3 yrs ago    Medications:  Infusions:  . heparin 2,000 Units/hr (03/28/20 1322)  . lactated ringers 125 mL/hr at 03/28/20 2102    Assessment: 56 yom presented to the ED with abdominal pain. Found to have a SBO. He is on chronic warfarin for history for VTE. INR is subtherapeutic at 1.5. Of note, he had surgery on 5/4.  -heparin level = 0.82 on 2000 units/hr  Goal of Therapy:  Heparin level 0.3-0.7 units/ml Monitor platelets by anticoagulation protocol: Yes   Plan:  Decrease heparin to 1800 units/hr Check an 8 hr heparin level Daily heparin level and CBC  Hildred Laser, PharmD Clinical Pharmacist **Pharmacist phone directory can now be found on [email or web address] ([email or web address]).  Listed under [place].

## 2020-03-29 DIAGNOSIS — K56609 Unspecified intestinal obstruction, unspecified as to partial versus complete obstruction: Secondary | ICD-10-CM

## 2020-03-29 LAB — LACTIC ACID, PLASMA: Lactic Acid, Venous: 1.2 mmol/L (ref 0.5–1.9)

## 2020-03-29 LAB — GLUCOSE, CAPILLARY
Glucose-Capillary: 100 mg/dL — ABNORMAL HIGH (ref 70–99)
Glucose-Capillary: 106 mg/dL — ABNORMAL HIGH (ref 70–99)
Glucose-Capillary: 106 mg/dL — ABNORMAL HIGH (ref 70–99)
Glucose-Capillary: 112 mg/dL — ABNORMAL HIGH (ref 70–99)
Glucose-Capillary: 121 mg/dL — ABNORMAL HIGH (ref 70–99)
Glucose-Capillary: 122 mg/dL — ABNORMAL HIGH (ref 70–99)
Glucose-Capillary: 144 mg/dL — ABNORMAL HIGH (ref 70–99)

## 2020-03-29 LAB — COMPREHENSIVE METABOLIC PANEL
ALT: 55 U/L — ABNORMAL HIGH (ref 0–44)
AST: 40 U/L (ref 15–41)
Albumin: 3.2 g/dL — ABNORMAL LOW (ref 3.5–5.0)
Alkaline Phosphatase: 66 U/L (ref 38–126)
Anion gap: 13 (ref 5–15)
BUN: 27 mg/dL — ABNORMAL HIGH (ref 8–23)
CO2: 26 mmol/L (ref 22–32)
Calcium: 9 mg/dL (ref 8.9–10.3)
Chloride: 103 mmol/L (ref 98–111)
Creatinine, Ser: 1.13 mg/dL (ref 0.61–1.24)
GFR calc Af Amer: >60 mL/min (ref >60)
GFR calc non Af Amer: >60 mL/min (ref >60)
Glucose, Bld: 124 mg/dL — ABNORMAL HIGH (ref 70–99)
Potassium: 3.7 mmol/L (ref 3.5–5.1)
Sodium: 142 mmol/L (ref 135–145)
Total Bilirubin: 1.2 mg/dL (ref 0.3–1.2)
Total Protein: 6.9 g/dL (ref 6.5–8.1)

## 2020-03-29 LAB — CBC
HCT: 35.9 % — ABNORMAL LOW (ref 39.0–52.0)
Hemoglobin: 11.4 g/dL — ABNORMAL LOW (ref 13.0–17.0)
MCH: 28.9 pg (ref 26.0–34.0)
MCHC: 31.8 g/dL (ref 30.0–36.0)
MCV: 90.9 fL (ref 80.0–100.0)
Platelets: 210 10*3/uL (ref 150–400)
RBC: 3.95 MIL/uL — ABNORMAL LOW (ref 4.22–5.81)
RDW: 14.2 % (ref 11.5–15.5)
WBC: 8.3 10*3/uL (ref 4.0–10.5)
nRBC: 0 % (ref 0.0–0.2)

## 2020-03-29 LAB — HEPARIN LEVEL (UNFRACTIONATED)
Heparin Unfractionated: 0.91 IU/mL — ABNORMAL HIGH (ref 0.30–0.70)
Heparin Unfractionated: 0.72 [IU]/mL — ABNORMAL HIGH (ref 0.30–0.70)

## 2020-03-29 LAB — PROTIME-INR
INR: 1.5 — ABNORMAL HIGH (ref 0.8–1.2)
Prothrombin Time: 17.1 seconds — ABNORMAL HIGH (ref 11.4–15.2)

## 2020-03-29 LAB — MAGNESIUM: Magnesium: 2.5 mg/dL — ABNORMAL HIGH (ref 1.7–2.4)

## 2020-03-29 MED ORDER — PROMETHAZINE HCL 25 MG/ML IJ SOLN
12.5000 mg | Freq: Four times a day (QID) | INTRAMUSCULAR | Status: DC | PRN
  Administered 2020-03-29 – 2020-03-30 (×2): 12.5 mg via INTRAVENOUS
  Filled 2020-03-29 (×2): qty 1

## 2020-03-29 MED ORDER — CHLORPROMAZINE HCL 25 MG/ML IJ SOLN
25.0000 mg | Freq: Once | INTRAMUSCULAR | Status: AC
  Administered 2020-03-29: 25 mg via INTRAMUSCULAR
  Filled 2020-03-29: qty 1

## 2020-03-29 MED ORDER — SODIUM CHLORIDE 0.9 % IV SOLN: 12.5000 mg | Freq: Once | INTRAVENOUS | Status: DC

## 2020-03-29 MED ORDER — MENTHOL 3 MG MT LOZG
1.0000 | LOZENGE | OROMUCOSAL | Status: DC | PRN
  Administered 2020-03-29: 3 mg via ORAL
  Filled 2020-03-29: qty 9

## 2020-03-29 MED ORDER — PHENOL 1.4 % MT LIQD
1.0000 | OROMUCOSAL | Status: DC | PRN
  Administered 2020-03-29 – 2020-04-02 (×2): 1 via OROMUCOSAL
  Filled 2020-03-29 (×2): qty 177

## 2020-03-29 NOTE — Progress Notes (Signed)
[place] for heparin Indication: hx VTE  Allergies  Allergen Reactions  . Iodine Hives    Patient Measurements: Height: 6\' 1"  (185.4 cm) Weight: 124.7 kg (274 lb 14.6 oz) IBW/kg (Calculated) : 79.9 Heparin Dosing Weight: 107.3kg  Vital Signs: Temp: 98 F (36.7 C) (05/11 0750) Temp Source: Oral (05/11 0750) BP: 141/93 (05/11 0750) Pulse Rate: 83 (05/11 0750)  Labs: Recent Labs    03/28/20 0546 03/28/20 1142 03/28/20 2102 03/29/20 0312 03/29/20 0723  HGB 12.7*  --   --  11.4*  --   HCT 39.7  --   --  35.9*  --   PLT 208  --   --  210  --   LABPROT  --  17.5*  --  17.1*  --   INR  --  1.5*  --  1.5*  --   HEPARINUNFRC  --   --  0.82*  --  0.91*  CREATININE 1.36*  --   --  1.13  --     Estimated Creatinine Clearance: 90.2 mL/min (by C-G formula based on SCr of 1.13 mg/dL).   Medical History: Past Medical History:  Diagnosis Date  . Arthritis   . DVT (deep venous thrombosis) ([place])    right PT and peroneal veins DVT 10/16/13   . Gout no recent flares  . Hypertension   . Prostate cancer ([place])   . Pulmonary embolus ([place])    right lung 2009; bilateral PE 10/15/13 post-prostatectomy  . Sleep apnea    does not use cpap, last sleep study more than 3 yrs ago    Medications:  Infusions:  . heparin 1,800 Units/hr (03/29/20 [id number])  . lactated ringers 125 mL/hr at 03/29/20 [id number]    Assessment: 77 yom presented to the ED with abdominal pain. Found to have a SBO. He is on chronic warfarin for history for VTE. INR is subtherapeutic at 1.5. Of note, he had surgery on 5/4.   Heparin level remains supratherapeutic s/p rate decrease to 1800 units/hr, no bleeding noted  Goal of Therapy:  Heparin level 0.3-0.7 units/ml Monitor platelets by anticoagulation protocol: Yes   Plan:  Decrease heparin gtt to 1600 units/hr F/u 6 hour heparin level  Bertis Ruddy, PharmD Clinical Pharmacist Please check AMION for all [place]  numbers 03/29/2020 8:16 AM

## 2020-03-29 NOTE — Progress Notes (Signed)
[place] for heparin Indication: hx VTE  Allergies  Allergen Reactions  . Iodine Hives    Patient Measurements: Height: 6\' 1"  (185.4 cm) Weight: 124.7 kg (274 lb 14.6 oz) IBW/kg (Calculated) : 79.9 Heparin Dosing Weight: 107.3kg  Vital Signs: Temp: 97.6 F (36.4 C) (05/11 1938) Temp Source: Oral (05/11 1938) BP: 130/89 (05/11 1938) Pulse Rate: 86 (05/11 1938)  Labs: Recent Labs    03/28/20 0546 03/28/20 1142 03/28/20 2102 03/29/20 0312 03/29/20 0723 03/29/20 1913  HGB 12.7*  --   --  11.4*  --   --   HCT 39.7  --   --  35.9*  --   --   PLT 208  --   --  210  --   --   LABPROT  --  17.5*  --  17.1*  --   --   INR  --  1.5*  --  1.5*  --   --   HEPARINUNFRC  --   --  0.82*  --  0.91* 0.72*  CREATININE 1.36*  --   --  1.13  --   --     Estimated Creatinine Clearance: 90.2 mL/min (by C-G formula based on SCr of 1.13 mg/dL).   Assessment: 36 yom presented to the ED with abdominal pain, found to have a SBO. He is on chronic warfarin for history for VTE.  Patient had surgery on 5/4 and Pharmacy asked to dose IV heparin.  Heparin level remains supra-therapeutic.  No issue with heparin infusion nor bleeding per RN.  Goal of Therapy:  Heparin level 0.3-0.7 units/ml Monitor platelets by anticoagulation protocol: Yes   Plan:  Decrease heparin gtt to 1400 units/hr F/U AM labs  Zyia Kaneko D. Mina Marble, PharmD, BCPS, [place] 03/29/2020, 7:51 PM

## 2020-03-29 NOTE — Progress Notes (Signed)
  Date: 03/29/2020  Patient name: Dwayne Lawrence  Medical record number: [id number]  Date of birth: 09-10-1956   I have seen and evaluated Dwayne Lawrence and discussed their care with the Residency Team. Briefly, Dwayne Lawrence came in with an SBO after surgery.  Today, he is feeling about the same.  +bloating, nausea and hiccups.  He has limited pain.  His Xray from yesterday evening did not show much progression.    Vitals:   03/29/20 0750 03/29/20 1144  BP: (!) 141/93 (!) 147/85  Pulse: 83 84  Resp: 15 16  Temp: 98 F (36.7 C) 98.5 F (36.9 C)  SpO2: 91% 92%   General: Lying in bed, appears uncomfortably HENT: NGT in place draining dark brown fluid Abd: Distended, tender to palpation, no bowel sounds on the left and minimal on the right MSK: Normal muscle tone for age Psych: Reports being uncomfortable.   Assessment and Plan: I have seen and evaluated the patient as outlined above. I agree with the formulated Assessment and Plan as detailed in the residents' note, with the following changes:   1. SBO - NGT in place to [facility] - Surgery consulted, follow recommendations - AXR tomorrow morning - Add phenergan for nausea control - Trial of thorazine for hiccups  Other issues per Dr. Chase Picket daily note.    Sid Falcon, MD 5/11/20211:20 PM

## 2020-03-29 NOTE — Progress Notes (Addendum)
NAME:  Dwayne Lawrence, MRN:  [id number], DOB:  1956/08/29, LOS: 1 ADMISSION DATE:  03/28/2020   Brief History  64 yo male who recently underwent an uncomplicated total left hip arthroplasty on 5/4 by Dr. Ninfa Linden and was subsequently discharged on 5/5. Pt developed progressive n/v and abdominal distension since 5/6 and presented to [facility] on 5/10 and was found to have a SBO. Last BM was on 5/3. Last flatus on 5/9. Pt reported having only took one day's worth of pain medication at home.  Subjective  No overnight events.  Seen at bedside with surgery. Dwayne Lawrence states he feels as miserable today as he has in the last week. He did feel the need to have a BM last night but only passed a small amount of gas. He also endorses nausea, continued hiccups. He denies hip pain though.  Surgery discussed that they will continue to monitor for now.  Significant Hospital Events   5/10>hospital admission 5/11>no significant changes in discomfort/nausea. NG suction turned up. Starting compazine and changing zofran to phenergan for nausea.  Objective   Blood pressure (!) 141/93, pulse 83, temperature 98 F (36.7 C), temperature source Oral, resp. rate 15, height 6\' 1"  (1.854 m), weight 124.7 kg, SpO2 91 %.     Intake/Output Summary (Last 24 hours) at 03/29/2020 1109 Last data filed at 03/29/2020 0830 Gross per 24 hour  Intake 3330.24 ml  Output 1850 ml  Net 1480.24 ml   Filed Weights   03/28/20 0528 03/28/20 1900  Weight: 124.7 kg 124.7 kg    Examination: GENERAL: acutely ill appearing but in no acute distress. HEENT: NG to suction CARDIAC: heart RRR. Extremities warm PULMONARY: breathing comfortably on room air ABDOMEN: distended and firm. Minimal bs present in RLQ. No bs in remaining quadrants.  Tenderness to light palpation.  Consults:  General surgery Ortho   Significant Diagnostic Tests:  5/10 CT a/p: SBO with transition zone in the mid jejuna level slightly to the right of the  midline in the mid abdomen. No free air. Somewhat swirl like appearance of the mid abd mesentary and vessels in mid abdomen near site of SBO--suspect internal hernia. No frank volvulus.  Micro Data:  n/a  Antimicrobials:  n/a  Summary  64 yo male who underwent an uncomplicated LTHA on 5/4 and developed progressive abd distension, n/v who was admitted to IMTS on 5/10 for medical management of SBO.  Assessment & Plan:  Active Problems:   SBO (small bowel obstruction) (HCC)  SBO s/p LTHA No symptoms bowel ischemia, fever, leukocytosis or other indication for emergent surgery on admission. General surgery on board. Present in room on rounds this morning and recommending medical management at this time. Increasing NG suction to 80-90 NG output 300 yesterday.  Remains afebrile without leukocytosis. VSS Discussed with pt that should his pain suddenly worsen, he is to let RN know right away. Hoping that increased suctioning will assist with discomfort. Plan Continue NG placed to suction--90-100 Repeat imaging in am per surgery Prn dilaudid for pain. Will change zofran to phenergan to see if that helps with nausea. Prn compazine for hiccups Ice chips ok. Lozenges/hard candy ok as long as he does not chew it. Continue IVF with LR  Monitor electrolytes Monitor for change in pain, fever, or other systemic signs. If pain acutely worsens, please notify MD immediately. If deemed appropriate after MD assessment, would suggest obtaining repeat CT to eval for perf and call surgery.  Left Total Hip Arthroplasty (03/22/20). Ortho  consulted on admission. WBAT. No hip precautions.  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 Has been on warfarin since that time. Would suspect this would have been considered provoked so unsure why he has been anticoagulated since then.  Plan: warfarin held on admission in case of need for emergent surgery. Continue heparin  drip.  AKI-resolved.  Hyperglycemia. A1C 6. Glucoses stable. Continue SSI HTN. Blood pressures trending up. Likely ok to restart amlodipine today.  Best practice:  CODE STATUS: FULL Diet: NPO except ice chips DVT for prophylaxis: heparin gtt Dispo: pending SBO medical management   Mitzi Hansen, MD [place] PGY-1 PAGER #: [phone] 03/29/20  11:09 AM  Labs    CBC Latest Ref Rng & Units 03/29/2020 03/28/2020 03/23/2020  WBC 4.0 - 10.5 K/uL 8.3 9.2 9.5  Hemoglobin 13.0 - 17.0 g/dL 11.4(L) 12.7(L) 12.9(L)  Hematocrit 39.0 - 52.0 % 35.9(L) 39.7 40.7  Platelets 150 - 400 K/uL 210 208 123(L)   BMP Latest Ref Rng & Units 03/29/2020 03/28/2020 03/23/2020  Glucose 70 - 99 mg/dL 124(H) 146(H) 153(H)  BUN 8 - 23 mg/dL 27(H) 27(H) 11  Creatinine 0.61 - 1.24 mg/dL 1.13 1.36(H) 1.05  Sodium 135 - 145 mmol/L 142 139 135  Potassium 3.5 - 5.1 mmol/L 3.7 3.8 4.4  Chloride 98 - 111 mmol/L 103 102 104  CO2 22 - 32 mmol/L 26 24 23   Calcium 8.9 - 10.3 mg/dL 9.0 9.4 8.8(L)

## 2020-03-29 NOTE — Progress Notes (Signed)
Patient ID: Dwayne Lawrence, male   DOB: 06/08/1956, 64 y.o.   MRN: [id number]       Subjective: Patient still with hiccups and uncomfortable from abdominal distention. No flatus or BMs.  Some nausea.  NGT set on 40 mm for suction and so therefore not pulling much out. Turned up to 90-100 and 200cc immediately returned  ROS: See above, otherwise other systems negative  Objective: Vital signs in last 24 hours: Temp:  [98 F (36.7 C)-99.7 F (37.6 C)] 98 F (36.7 C) (05/11 0750) Pulse Rate:  [81-113] 83 (05/11 0750) Resp:  [14-20] 15 (05/11 0750) BP: (118-149)/(82-100) 141/93 (05/11 0750) SpO2:  [89 %-95 %] 91 % (05/11 0750) Weight:  [124.7 kg] 124.7 kg (05/10 1900) Last BM Date: (Prior to admission)  Intake/Output from previous day: 05/10 0701 - 05/11 0700 In: 3330.2 [I.V.:2330.2; IV Piggyback:1000] Out: 1450 [Urine:600; Emesis/NG output:850] Intake/Output this shift: Total I/O In: -  Out: 400 [Emesis/NG output:400]  PE: Heart: regular Lungs: CTAB Abd: distended, NGT in place with some bilious output, minimally tender to palpation in mid-abdomen, but no guarding or rebounding.  Lab Results:  Recent Labs    03/28/20 0546 03/29/20 0312  WBC 9.2 8.3  HGB 12.7* 11.4*  HCT 39.7 35.9*  PLT 208 210   BMET Recent Labs    03/28/20 0546 03/29/20 0312  NA 139 142  K 3.8 3.7  CL 102 103  CO2 24 26  GLUCOSE 146* 124*  BUN 27* 27*  CREATININE 1.36* 1.13  CALCIUM 9.4 9.0   PT/INR Recent Labs    03/28/20 1142 03/29/20 0312  LABPROT 17.5* 17.1*  INR 1.5* 1.5*   CMP     Component Value Date/Time   NA 142 03/29/2020 0312   K 3.7 03/29/2020 0312   CL 103 03/29/2020 0312   CO2 26 03/29/2020 0312   GLUCOSE 124 (H) 03/29/2020 0312   BUN 27 (H) 03/29/2020 0312   CREATININE 1.13 03/29/2020 0312   CALCIUM 9.0 03/29/2020 0312   PROT 6.9 03/29/2020 0312   ALBUMIN 3.2 (L) 03/29/2020 0312   AST 40 03/29/2020 0312   ALT 55 (H) 03/29/2020 0312   ALKPHOS 66 03/29/2020  0312   BILITOT 1.2 03/29/2020 0312   GFRNONAA >60 03/29/2020 0312   GFRAA >60 03/29/2020 0312   Lipase     Component Value Date/Time   LIPASE 56 (H) 03/28/2020 0546       Studies/Results: DG Abdomen 1 View  Result Date: 03/28/2020 CLINICAL DATA:  Enteric tube placement. EXAM: ABDOMEN - 1 VIEW COMPARISON:  CT abdomen pelvis from same day. FINDINGS: Enteric tube in the stomach. Multiple prominently dilated loops of proximal small bowel again noted. No radio-opaque calculi or other significant radiographic abnormality are seen. No acute osseous abnormality. IMPRESSION: 1. Appropriately positioned enteric tube. 2. Unchanged small bowel obstruction. Electronically Signed   By: Titus Dubin M.D.   On: 03/28/2020 11:41   CT Abdomen Pelvis W Contrast  Result Date: 03/28/2020 CLINICAL DATA:  Abdominal pain with nausea and vomiting. Status post recent total hip replacement EXAM: CT ABDOMEN AND PELVIS WITH CONTRAST TECHNIQUE: Multidetector CT imaging of the abdomen and pelvis was performed using the standard protocol following bolus administration of intravenous contrast. CONTRAST:  140mL OMNIPAQUE IOHEXOL 300 MG/ML  SOLN COMPARISON:  July 15, 2017 FINDINGS: Lower chest: There is atelectatic change in the lung bases. No airspace consolidation seen in the lung bases currently. Hepatobiliary: No focal liver lesions are appreciable. Gallbladder wall is not  appreciably thickened. There is no biliary duct dilatation. Pancreas: There is no pancreatic mass or inflammatory focus. Spleen: No splenic lesions are evident. Adrenals/Urinary Tract: Adrenals bilaterally appear normal. Kidneys bilaterally show no evident mass or hydronephrosis on either side. There is no evident renal or ureteral calculus on either side. Urinary bladder is midline with wall thickness within normal limits. Stomach/Bowel: There is bowel dilatation to the mid jejunal level where there is a rather abrupt transition zone consistent with  bowel obstruction. The vessels in the mid abdominal mesentery near this point obstruction have a somewhat swirl like appearance which raises concern for possible underlying internal hernia. There is no free air or portal venous air. The more distal loops of small bowel have a normal caliber. The terminal ileum appears normal. No free air or portal venous air is evident. Vascular/Lymphatic: There is no abdominal aortic aneurysm. There are scattered foci of atherosclerotic calcification in the distal aorta and common iliac arteries. Major venous structures appear patent. There is no evident adenopathy in the abdomen or pelvis. Reproductive: Prostate surgically absent. A few calcifications are noted in the periprostatic region. No evident pelvic mass. Other: Appendix absent. No periappendiceal region inflammation. No abscess or ascites evident in the abdomen or pelvis. Musculoskeletal: Patient is status post recent total hip replacement on the left with prosthetic components well-seated. There is soft tissue air and hematoma immediately anterior and lateral to the left tensor fascia lata muscle. This area hematoma measures 6.4 x 2.6 cm. There is soft tissue stranding in this area. These changes are felt to be secondary to the recent total hip replacement. Elsewhere, there is mild degenerative change in the lumbar spine. No blastic or lytic bone lesions. No intramuscular lesions beyond mild left thigh muscle edema secondary to recent hip surgery. IMPRESSION: 1. Small bowel obstruction with a rather abrupt transition zone in the mid jejunal level slightly to the right of midline in the mid abdomen. No free air or portal venous air. 2. Somewhat swirl like appearance of the mid abdominal mesentery and vessels in the mid abdomen near the site of bowel obstruction. Suspect internal hernia in this area. No frank bowel volvulus is evident by CT on this examination. Bowel distal to the obstruction in the mid jejunal level  appears unremarkable. No free air or portal venous air. 3.  Prostate and appendix absent. 4. Hematoma with air in the anterior upper thigh region on the left consistent with recent total hip replacement. Total hip replacement on the left noted with prosthetic components well-seated. No blastic or lytic bone lesions. Electronically Signed   By: Lowella Grip III M.D.   On: 03/28/2020 09:30   DG Abd Portable 1V-Small Bowel Obstruction Protocol-initial, 8 hr delay  Result Date: 03/28/2020 CLINICAL DATA:  Small-bowel obstruction EXAM: PORTABLE ABDOMEN - 1 VIEW COMPARISON:  None. FINDINGS: Multiple air-filled dilated loops of bowel are seen throughout the abdomen measuring up to 6 cm, grossly unchanged from the prior exam. There is air seen within the ascending colon. There is a relative paucity of bowel gas seen distally within the descending colon in comparison to the prior exam. Small amount of contrast seen within the bladder. IMPRESSION: Findings of small bowel obstruction, with degree of dilation unchanged from prior exam. Electronically Signed   By: Prudencio Pair M.D.   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  See if this helps with better decompression today -repeat films in am.  If some improvement, may try SBO protocol again, given contrast likely just got sucked back out yesterday as it is not visible on films today.  If no improvement, then will need to discuss the possibility of surgical intervention, but would like the give the patient the opportunity and time to get better with conservative management. -mobilize -may have ice, gum, hard candy, cepacol, chloraseptic spray -discussed plan with  patient, daughter, wife on phone, and primary service was in the room at the time as well.   FEN - NPO/NGT VTE - coumadin on hold, heparin gtt ID - none needed   LOS: 1 day    Henreitta Cea , [facility] 03/29/2020, 10:05 AM Please see Amion for pager number during day hours 7:00am-4:30pm or 7:00am -11:30am on weekends

## 2020-03-29 NOTE — Progress Notes (Signed)
Mobility Note    03/29/20 1600  Mobility  Activity Ambulated in hall  Level of Assistance Minimal assist, patient does 75% or more  Assistive Device Front wheel walker  LLE Weight Bearing WBAT  Distance Ambulated (ft) 300 ft  Mobility Response Tolerated well  Mobility performed by Mobility specialist    [place] Pager [phone] Office [phone]

## 2020-03-30 ENCOUNTER — Inpatient Hospital Stay (HOSPITAL_COMMUNITY): Payer: Medicare HMO

## 2020-03-30 LAB — GLUCOSE, CAPILLARY
Glucose-Capillary: 100 mg/dL — ABNORMAL HIGH (ref 70–99)
Glucose-Capillary: 100 mg/dL — ABNORMAL HIGH (ref 70–99)
Glucose-Capillary: 108 mg/dL — ABNORMAL HIGH (ref 70–99)
Glucose-Capillary: 112 mg/dL — ABNORMAL HIGH (ref 70–99)
Glucose-Capillary: 96 mg/dL (ref 70–99)
Glucose-Capillary: 98 mg/dL (ref 70–99)

## 2020-03-30 LAB — CBC
HCT: 36.9 % — ABNORMAL LOW (ref 39.0–52.0)
Hemoglobin: 11.6 g/dL — ABNORMAL LOW (ref 13.0–17.0)
MCH: 28.7 pg (ref 26.0–34.0)
MCHC: 31.4 g/dL (ref 30.0–36.0)
MCV: 91.3 fL (ref 80.0–100.0)
Platelets: 219 10*3/uL (ref 150–400)
RBC: 4.04 MIL/uL — ABNORMAL LOW (ref 4.22–5.81)
RDW: 14.1 % (ref 11.5–15.5)
WBC: 7.2 10*3/uL (ref 4.0–10.5)
nRBC: 0 % (ref 0.0–0.2)

## 2020-03-30 LAB — BASIC METABOLIC PANEL
Anion gap: 13 (ref 5–15)
BUN: 29 mg/dL — ABNORMAL HIGH (ref 8–23)
CO2: 27 mmol/L (ref 22–32)
Calcium: 8.9 mg/dL (ref 8.9–10.3)
Chloride: 103 mmol/L (ref 98–111)
Creatinine, Ser: 1.23 mg/dL (ref 0.61–1.24)
GFR calc Af Amer: >60 mL/min (ref >60)
GFR calc non Af Amer: >60 mL/min (ref >60)
Glucose, Bld: 117 mg/dL — ABNORMAL HIGH (ref 70–99)
Potassium: 3.7 mmol/L (ref 3.5–5.1)
Sodium: 143 mmol/L (ref 135–145)

## 2020-03-30 LAB — HEPARIN LEVEL (UNFRACTIONATED): Heparin Unfractionated: 0.51 IU/mL (ref 0.30–0.70)

## 2020-03-30 MED ORDER — LACTATED RINGERS IV SOLN: INTRAVENOUS | Status: DC

## 2020-03-30 NOTE — Progress Notes (Signed)
PT Cancellation Note  Patient Details Name: Dwayne Lawrence MRN: [id number] DOB: [birth year]   Cancelled Treatment:    Reason Eval/Treat Not Completed: Medical issues which prohibited therapy Pt reports he has been nauseous and vomiting all day and wants to hold on PT. Will follow up as schedule allows.   [name], DPT  Acute Rehabilitation Services  Pager: [phone] Office: [phone]    [name] 03/30/2020, 4:37 PM

## 2020-03-30 NOTE — Progress Notes (Signed)
Patient ID: Dwayne Lawrence, male   DOB: 09/02/1956, 64 y.o.   MRN: [id number]       Subjective: No better, no worse.  Still no flatus.  No abdominal pain currently.  Still distended  ROS: See above, otherwise other systems negative  Objective: Vital signs in last 24 hours: Temp:  [97.6 F (36.4 C)-98.8 F (37.1 C)] 97.8 F (36.6 C) (05/12 0742) Pulse Rate:  [84-93] 87 (05/12 0746) Resp:  [13-20] 13 (05/12 0746) BP: (130-147)/(85-94) 135/89 (05/12 0746) SpO2:  [91 %-93 %] 92 % (05/12 0746) Last BM Date: (Prior to admission)  Intake/Output from previous day: 05/11 0701 - 05/12 0700 In: 0  Out: 2250 [Urine:700; Emesis/NG output:1550] Intake/Output this shift: Total I/O In: 0  Out: 800 [Emesis/NG output:800]  PE: Heart: regular Lungs: CTAB Abd: soft, but distended, hypoactive BS, NGT in place with more thick, bilious, feculent appearing output today  Lab Results:  Recent Labs    03/29/20 0312 03/30/20 0402  WBC 8.3 7.2  HGB 11.4* 11.6*  HCT 35.9* 36.9*  PLT 210 219   BMET Recent Labs    03/29/20 0312 03/30/20 0402  NA 142 143  K 3.7 3.7  CL 103 103  CO2 26 27  GLUCOSE 124* 117*  BUN 27* 29*  CREATININE 1.13 1.23  CALCIUM 9.0 8.9   PT/INR Recent Labs    03/28/20 1142 03/29/20 0312  LABPROT 17.5* 17.1*  INR 1.5* 1.5*   CMP     Component Value Date/Time   NA 143 03/30/2020 0402   K 3.7 03/30/2020 0402   CL 103 03/30/2020 0402   CO2 27 03/30/2020 0402   GLUCOSE 117 (H) 03/30/2020 0402   BUN 29 (H) 03/30/2020 0402   CREATININE 1.23 03/30/2020 0402   CALCIUM 8.9 03/30/2020 0402   PROT 6.9 03/29/2020 0312   ALBUMIN 3.2 (L) 03/29/2020 0312   AST 40 03/29/2020 0312   ALT 55 (H) 03/29/2020 0312   ALKPHOS 66 03/29/2020 0312   BILITOT 1.2 03/29/2020 0312   GFRNONAA >60 03/30/2020 0402   GFRAA >60 03/30/2020 0402   Lipase     Component Value Date/Time   LIPASE 56 (H) 03/28/2020 0546       Studies/Results: DG Abdomen 1 View  Result Date:  03/28/2020 CLINICAL DATA:  Enteric tube placement. EXAM: ABDOMEN - 1 VIEW COMPARISON:  CT abdomen pelvis from same day. FINDINGS: Enteric tube in the stomach. Multiple prominently dilated loops of proximal small bowel again noted. No radio-opaque calculi or other significant radiographic abnormality are seen. No acute osseous abnormality. IMPRESSION: 1. Appropriately positioned enteric tube. 2. Unchanged small bowel obstruction. Electronically Signed   By: Titus Dubin M.D.   On: 03/28/2020 11:41   DG Abd Portable 1V  Result Date: 03/30/2020 CLINICAL DATA:  Small bowel obstruction. EXAM: PORTABLE ABDOMEN - 1 VIEW COMPARISON:  Abdominal x-rays and CT abdomen pelvis dated Mar 28, 2020. FINDINGS: Unchanged enteric tube within the stomach. Multiple prominently dilated loops of small bowel are similar to prior study. No radio-opaque calculi or other significant radiographic abnormality are seen. Prior left total hip arthroplasty. IMPRESSION: 1. Unchanged small bowel obstruction. Electronically Signed   By: Titus Dubin M.D.   On: 03/30/2020 07:52   DG Abd Portable 1V-Small Bowel Obstruction Protocol-initial, 8 hr delay  Result Date: 03/28/2020 CLINICAL DATA:  Small-bowel obstruction EXAM: PORTABLE ABDOMEN - 1 VIEW COMPARISON:  None. FINDINGS: Multiple air-filled dilated loops of bowel are seen throughout the abdomen measuring up to 6 cm,  grossly unchanged from the prior exam. There is air seen within the ascending colon. There is a relative paucity of bowel gas seen distally within the descending colon in comparison to the prior exam. Small amount of contrast seen within the bladder. IMPRESSION: Findings of small bowel obstruction, with degree of dilation unchanged from prior exam. Electronically Signed   By: Prudencio Pair M.D.   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  If no improvement by tomorrow, will likely require laparotomy for surgical exploration and LOA. -discussed this possibility at length with patient, daughter, and wife on the phone.  They understand -cont to mobilize as able -repeat film in am -will hold heparin in am given likelihood of OR tomorrow.   FEN -NPO/NGT VTE -coumadin on hold, heparin gtt ID -none needed   LOS: 2 days    Henreitta Cea , [facility] 03/30/2020, 10:17 AM Please see Amion for pager number during day hours 7:00am-4:30pm or 7:00am -11:30am on weekends

## 2020-03-30 NOTE — Progress Notes (Signed)
[place] for heparin Indication: hx VTE  Allergies  Allergen Reactions  . Iodine Hives    Patient Measurements: Height: 6\' 1"  (185.4 cm) Weight: 124.7 kg (274 lb 14.6 oz) IBW/kg (Calculated) : 79.9 Heparin Dosing Weight: 107.3kg  Vital Signs: Temp: 98.8 F (37.1 C) (05/12 0407) Temp Source: Oral (05/12 0407) BP: 141/90 (05/12 0407) Pulse Rate: 90 (05/12 0407)  Labs: Recent Labs     0000 03/28/20 0546 03/28/20 1142 03/28/20 2102 03/29/20 0312 03/29/20 0723 03/29/20 1913 03/30/20 0402  HGB   < > 12.7*  --   --  11.4*  --   --  11.6*  HCT  --  39.7  --   --  35.9*  --   --  36.9*  PLT  --  208  --   --  210  --   --  219  LABPROT  --   --  17.5*  --  17.1*  --   --   --   INR  --   --  1.5*  --  1.5*  --   --   --   HEPARINUNFRC  --   --   --    < >  --  0.91* 0.72* 0.51  CREATININE  --  1.36*  --   --  1.13  --   --  1.23   < > = values in this interval not displayed.    Estimated Creatinine Clearance: 82.8 mL/min (by C-G formula based on SCr of 1.23 mg/dL).   Assessment: 47 yom presented to the ED with abdominal pain, found to have a SBO. He is on chronic warfarin for history for VTE.  Patient had surgery on 5/4 and Pharmacy asked to dose IV heparin.  Heparin level therapeutic s/p rate decrease to 1400 units/hr  Goal of Therapy:  Heparin level 0.3-0.7 units/ml Monitor platelets by anticoagulation protocol: Yes   Plan:  Continue heparin gtt at 1400 units/hr Daily heparin level, CBC, s/s bleeding  Bertis Ruddy, PharmD Clinical Pharmacist Please check AMION for all [place] numbers 03/30/2020 7:20 AM

## 2020-03-30 NOTE — Anesthesia Preprocedure Evaluation (Addendum)
Anesthesia Evaluation  Patient identified by MRN, date of birth, ID band Patient awake    Reviewed: Allergy & Precautions, NPO status , Patient's Chart, lab work & pertinent test results  History of Anesthesia Complications Negative for: history of anesthetic complications  Airway Mallampati: III  TM Distance: >3 FB Neck ROM: Full    Dental no notable dental hx.    Pulmonary sleep apnea , former smoker, PE (2009, 2014, on coumadin (bridging with heparin))   Pulmonary exam normal        Cardiovascular hypertension, Pt. on medications Normal cardiovascular exam     Neuro/Psych negative neurological ROS  negative psych ROS   GI/Hepatic Neg liver ROS, SBO, has NGT   Endo/Other  negative endocrine ROS  Renal/GU negative Renal ROS  negative genitourinary   Musculoskeletal  (+) Arthritis ,   Abdominal   Peds  Hematology Hgb 11.6, INR 1.5   Anesthesia Other Findings Day of surgery medications reviewed with patient.  Reproductive/Obstetrics negative OB ROS                           Anesthesia Physical Anesthesia Plan  ASA: II  Anesthesia Plan: General   Post-op Pain Management:    Induction: Intravenous, Cricoid pressure planned and Rapid sequence  PONV Risk Score and Plan: 4 or greater and Treatment may vary due to age or medical condition, Ondansetron, Dexamethasone and Midazolam  Airway Management Planned: Oral ETT and Video Laryngoscope Planned  Additional Equipment: None  Intra-op Plan:   Post-operative Plan: Extubation in OR  Informed Consent: I have reviewed the patients History and Physical, chart, labs and discussed the procedure including the risks, benefits and alternatives for the proposed anesthesia with the patient or authorized representative who has indicated his/her understanding and acceptance.     Dental advisory given  Plan Discussed with: CRNA  Anesthesia  Plan Comments:       Anesthesia Quick Evaluation

## 2020-03-30 NOTE — Progress Notes (Signed)
NAME:  Dwayne Lawrence, MRN:  [id number], DOB:  1956/03/20, LOS: 2 ADMISSION DATE:  03/28/2020   Brief History  64 yo male who recently underwent an uncomplicated total left hip arthroplasty on 5/4 by Dr. Ninfa Linden and was subsequently discharged on 5/5. Pt developed progressive n/v and abdominal distension since 5/6 and presented to [facility] on 5/10 and was found to have a SBO. Last BM was on 5/3. Last flatus on 5/9. Pt reported having only took one day's worth of pain medication at home.  Subjective/Interm history  No overnight events Pt reporting improvement in his nausea.  Surgery at bedside this morning and discussed plan to monitor one more day--possible surgery tomorrow if no improvement.  Significant Hospital Events   5/10>hospital admission 5/11>no significant changes in discomfort/nausea. NG suction turned up. Starting compazine and changing zofran to phenergan for nausea. 5/12>no significant changes on abd xray this morning. Symptoms improved. Having dark fecal appearing NG output  Objective   Blood pressure 135/89, pulse 87, temperature 97.8 F (36.6 C), temperature source Oral, resp. rate 13, height 6\' 1"  (1.854 m), weight 124.7 kg, SpO2 92 %.     Intake/Output Summary (Last 24 hours) at 03/30/2020 1058 Last data filed at 03/30/2020 0806 Gross per 24 hour  Intake 0 ml  Output 2650 ml  Net -2650 ml   Filed Weights   03/28/20 0528 03/28/20 1900  Weight: 124.7 kg 124.7 kg    Examination: GENERAL: ill appearing but in NAD HEENT: NG to suction with dark fecal appearing material in suction canister CARDIAC: heart RRR. Extremities warm PULMONARY: breathing comfortably on room air ABDOMEN: abd remains distended. Maybe slightly less firm than yesterday. No pain on palpation  Consults:  General surgery Ortho   Significant Diagnostic Tests:  5/10 CT a/p: SBO with transition zone in the mid jejuna level slightly to the right of the midline in the mid abdomen. No free air.  Somewhat swirl like appearance of the mid abd mesentary and vessels in mid abdomen near site of SBO--suspect internal hernia. No frank volvulus.  Micro Data:  n/a  Antimicrobials:  n/a  Summary  64 yo male who underwent an uncomplicated LTHA on 5/4 and developed progressive abd distension, n/v who was admitted to IMTS on 5/10 for medical management of SBO. Resolved hospital problems  AKI Assessment & Plan:  Active Problems:   SBO (small bowel obstruction) (HCC)  SBO s/p LTHA Gen surg on board NG output 2150 yesterday.  Having good output from NG today with dark fecal appearing output. Electrolytes stable. Remains afebrile without leukocytosis. VSS. Remains without indication for emergent lapartomy No significant SBO changes on abd imaging this morning. Plan --Surgery planning to repeat imaging tomorrow morning--if no significant changes/improvement in obstruction, will likely need to take for laparotomy --Continue NG placed to suction--90-100 --Continue IVF with LR  --Monitor electrolytes --Prn dilaudid for pain. Continue phenergana. Prn compazine for hiccups --Ice chips ok. Lozenges/hard candy ok as long as he does not chew it. --Monitor for change in pain, fever, or other systemic signs. If pain acutely worsens, please notify MD immediately. If deemed appropriate after MD assessment, would suggest obtaining repeat CT to eval for perf and call surgery.  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 Has been on warfarin since that time. Would suspect this would have been considered provoked so unsure why he has been anticoagulated since then.  Plan: warfarin held on admission in case of need for emergent surgery. Continue heparin  drip.   Chronic/Stable medical problems   Left Total Hip Arthroplasty (03/22/20). Ortho consulted on admission. WBAT. No hip precautions. Hyperglycemia. A1C 6. Glucoses stable. Continue SSI HTN. Blood pressures stable.  Best practice:    CODE STATUS: FULL Diet: NPO except ice chips DVT for prophylaxis: heparin gtt Dispo: pending SBO medical management   Mitzi Hansen, MD [place] PGY-1 PAGER #: [phone] 03/30/20  10:58 AM  Labs    CBC Latest Ref Rng & Units 03/30/2020 03/29/2020 03/28/2020  WBC 4.0 - 10.5 K/uL 7.2 8.3 9.2  Hemoglobin 13.0 - 17.0 g/dL 11.6(L) 11.4(L) 12.7(L)  Hematocrit 39.0 - 52.0 % 36.9(L) 35.9(L) 39.7  Platelets 150 - 400 K/uL 219 210 208   BMP Latest Ref Rng & Units 03/30/2020 03/29/2020 03/28/2020  Glucose 70 - 99 mg/dL 117(H) 124(H) 146(H)  BUN 8 - 23 mg/dL 29(H) 27(H) 27(H)  Creatinine 0.61 - 1.24 mg/dL 1.23 1.13 1.36(H)  Sodium 135 - 145 mmol/L 143 142 139  Potassium 3.5 - 5.1 mmol/L 3.7 3.7 3.8  Chloride 98 - 111 mmol/L 103 103 102  CO2 22 - 32 mmol/L 27 26 24   Calcium 8.9 - 10.3 mg/dL 8.9 9.0 9.4

## 2020-03-31 ENCOUNTER — Encounter (HOSPITAL_COMMUNITY)
Admission: EM | Disposition: A | Discharge status: Home Health | Payer: Self-pay | Source: Home / Self Care | Attending: Internal Medicine

## 2020-03-31 ENCOUNTER — Inpatient Hospital Stay (HOSPITAL_COMMUNITY): Payer: Medicare HMO | Admitting: Anesthesiology

## 2020-03-31 ENCOUNTER — Inpatient Hospital Stay (HOSPITAL_COMMUNITY): Payer: Medicare HMO

## 2020-03-31 HISTORY — PX: LAPAROSCOPIC LYSIS OF ADHESIONS: SHX5905

## 2020-03-31 HISTORY — PX: SMALL BOWEL REPAIR: SHX6447

## 2020-03-31 LAB — BASIC METABOLIC PANEL
Anion gap: 14 (ref 5–15)
BUN: 27 mg/dL — ABNORMAL HIGH (ref 8–23)
CO2: 23 mmol/L (ref 22–32)
Calcium: 8.8 mg/dL — ABNORMAL LOW (ref 8.9–10.3)
Chloride: 107 mmol/L (ref 98–111)
Creatinine, Ser: 1.18 mg/dL (ref 0.61–1.24)
GFR calc Af Amer: >60 mL/min (ref >60)
GFR calc non Af Amer: >60 mL/min (ref >60)
Glucose, Bld: 105 mg/dL — ABNORMAL HIGH (ref 70–99)
Potassium: 3.7 mmol/L (ref 3.5–5.1)
Sodium: 144 mmol/L (ref 135–145)

## 2020-03-31 LAB — GLUCOSE, CAPILLARY
Glucose-Capillary: 106 mg/dL — ABNORMAL HIGH (ref 70–99)
Glucose-Capillary: 104 mg/dL — ABNORMAL HIGH (ref 70–99)
Glucose-Capillary: 109 mg/dL — ABNORMAL HIGH (ref 70–99)
Glucose-Capillary: 129 mg/dL — ABNORMAL HIGH (ref 70–99)
Glucose-Capillary: 128 mg/dL — ABNORMAL HIGH (ref 70–99)
Glucose-Capillary: 133 mg/dL — ABNORMAL HIGH (ref 70–99)
Glucose-Capillary: 129 mg/dL — ABNORMAL HIGH (ref 70–99)

## 2020-03-31 LAB — CBC
HCT: 37 % — ABNORMAL LOW (ref 39.0–52.0)
Hemoglobin: 11.6 g/dL — ABNORMAL LOW (ref 13.0–17.0)
MCH: 28.7 pg (ref 26.0–34.0)
MCHC: 31.4 g/dL (ref 30.0–36.0)
MCV: 91.6 fL (ref 80.0–100.0)
Platelets: 242 10*3/uL (ref 150–400)
RBC: 4.04 MIL/uL — ABNORMAL LOW (ref 4.22–5.81)
RDW: 13.9 % (ref 11.5–15.5)
WBC: 9.8 10*3/uL (ref 4.0–10.5)
nRBC: 0 % (ref 0.0–0.2)

## 2020-03-31 LAB — MAGNESIUM: Magnesium: 2.4 mg/dL (ref 1.7–2.4)

## 2020-03-31 SURGERY — LYSIS, ADHESIONS, LAPAROSCOPIC
Anesthesia: General | Site: Abdomen

## 2020-03-31 MED ORDER — ROCURONIUM BROMIDE 10 MG/ML (PF) SYRINGE
PREFILLED_SYRINGE | INTRAVENOUS | Status: AC
  Filled 2020-03-31: qty 10

## 2020-03-31 MED ORDER — SUGAMMADEX SODIUM 500 MG/5ML IV SOLN
INTRAVENOUS | Status: AC
  Filled 2020-03-31: qty 5

## 2020-03-31 MED ORDER — LIDOCAINE 2% (20 MG/ML) 5 ML SYRINGE
INTRAMUSCULAR | Status: DC | PRN
  Administered 2020-03-31: 100 mg via INTRAVENOUS

## 2020-03-31 MED ORDER — MIDAZOLAM HCL 2 MG/2ML IJ SOLN
INTRAMUSCULAR | Status: AC
  Filled 2020-03-31: qty 2

## 2020-03-31 MED ORDER — OXYCODONE HCL 5 MG PO TABS: 5.0000 mg | ORAL_TABLET | Freq: Once | ORAL | Status: DC | PRN

## 2020-03-31 MED ORDER — ONDANSETRON HCL 4 MG/2ML IJ SOLN
INTRAMUSCULAR | Status: AC
  Filled 2020-03-31: qty 2

## 2020-03-31 MED ORDER — DEXAMETHASONE SODIUM PHOSPHATE 10 MG/ML IJ SOLN
INTRAMUSCULAR | Status: AC
  Filled 2020-03-31: qty 1

## 2020-03-31 MED ORDER — FENTANYL CITRATE (PF) 250 MCG/5ML IJ SOLN
INTRAMUSCULAR | Status: AC
  Filled 2020-03-31: qty 5

## 2020-03-31 MED ORDER — SUCCINYLCHOLINE CHLORIDE 20 MG/ML IJ SOLN
INTRAMUSCULAR | Status: DC | PRN
  Administered 2020-03-31: 160 mg via INTRAVENOUS

## 2020-03-31 MED ORDER — OXYCODONE HCL 5 MG PO TABS: 5.0000 mg | ORAL_TABLET | Freq: Four times a day (QID) | ORAL | Status: DC | PRN

## 2020-03-31 MED ORDER — 0.9 % SODIUM CHLORIDE (POUR BTL) OPTIME
TOPICAL | Status: DC | PRN
  Administered 2020-03-31: 1000 mL

## 2020-03-31 MED ORDER — BUPIVACAINE HCL (PF) 0.25 % IJ SOLN
INTRAMUSCULAR | Status: DC | PRN
Start: 2020-03-31 — End: 2020-03-31
  Administered 2020-03-31: 25 mL

## 2020-03-31 MED ORDER — ONDANSETRON HCL 4 MG/2ML IJ SOLN
INTRAMUSCULAR | Status: DC | PRN
  Administered 2020-03-31: 4 mg via INTRAVENOUS

## 2020-03-31 MED ORDER — SUGAMMADEX SODIUM 200 MG/2ML IV SOLN
INTRAVENOUS | Status: DC | PRN
  Administered 2020-03-31: 200 mg via INTRAVENOUS
  Administered 2020-03-31: 100 mg via INTRAVENOUS

## 2020-03-31 MED ORDER — ROCURONIUM BROMIDE 100 MG/10ML IV SOLN
INTRAVENOUS | Status: DC | PRN
  Administered 2020-03-31 (×3): 10 mg via INTRAVENOUS
  Administered 2020-03-31: 50 mg via INTRAVENOUS
  Administered 2020-03-31 (×3): 10 mg via INTRAVENOUS

## 2020-03-31 MED ORDER — HYDROMORPHONE HCL 1 MG/ML IJ SOLN
0.2500 mg | INTRAMUSCULAR | Status: DC | PRN
  Administered 2020-03-31 (×2): 0.25 mg via INTRAVENOUS

## 2020-03-31 MED ORDER — OXYCODONE HCL 5 MG/5ML PO SOLN: 5.0000 mg | Freq: Once | ORAL | Status: DC | PRN

## 2020-03-31 MED ORDER — MIDAZOLAM HCL 5 MG/5ML IJ SOLN
INTRAMUSCULAR | Status: DC | PRN
  Administered 2020-03-31: 1 mg via INTRAVENOUS

## 2020-03-31 MED ORDER — PROMETHAZINE HCL 25 MG/ML IJ SOLN: 6.2500 mg | INTRAMUSCULAR | Status: DC | PRN

## 2020-03-31 MED ORDER — SODIUM CHLORIDE 0.9 % IV BOLUS
1000.0000 mL | Freq: Once | INTRAVENOUS | Status: AC
  Administered 2020-03-31: 1000 mL via INTRAVENOUS

## 2020-03-31 MED ORDER — SUCCINYLCHOLINE CHLORIDE 200 MG/10ML IV SOSY
PREFILLED_SYRINGE | INTRAVENOUS | Status: AC
  Filled 2020-03-31: qty 10

## 2020-03-31 MED ORDER — ONDANSETRON HCL 4 MG/2ML IJ SOLN: 4.0000 mg | Freq: Once | INTRAMUSCULAR | Status: DC | PRN

## 2020-03-31 MED ORDER — PROPOFOL 10 MG/ML IV BOLUS
INTRAVENOUS | Status: AC
  Filled 2020-03-31: qty 40

## 2020-03-31 MED ORDER — BUPIVACAINE HCL (PF) 0.25 % IJ SOLN
INTRAMUSCULAR | Status: AC
  Filled 2020-03-31: qty 30

## 2020-03-31 MED ORDER — HYDROMORPHONE HCL 1 MG/ML IJ SOLN
INTRAMUSCULAR | Status: AC
  Filled 2020-03-31: qty 1

## 2020-03-31 MED ORDER — DEXTROSE 5 % IV SOLN
INTRAVENOUS | Status: DC | PRN
  Administered 2020-03-31: 3 g via INTRAVENOUS

## 2020-03-31 MED ORDER — FENTANYL CITRATE (PF) 100 MCG/2ML IJ SOLN: 25.0000 ug | INTRAMUSCULAR | Status: DC | PRN

## 2020-03-31 MED ORDER — LIDOCAINE 2% (20 MG/ML) 5 ML SYRINGE
INTRAMUSCULAR | Status: AC
  Filled 2020-03-31: qty 5

## 2020-03-31 MED ORDER — LACTATED RINGERS IV SOLN: INTRAVENOUS | Status: DC | PRN

## 2020-03-31 MED ORDER — FENTANYL CITRATE (PF) 250 MCG/5ML IJ SOLN
INTRAMUSCULAR | Status: DC | PRN
  Administered 2020-03-31 (×2): 25 ug via INTRAVENOUS
  Administered 2020-03-31 (×2): 50 ug via INTRAVENOUS
  Administered 2020-03-31: 100 ug via INTRAVENOUS
  Administered 2020-03-31: 50 ug via INTRAVENOUS

## 2020-03-31 MED ORDER — PHENYLEPHRINE 40 MCG/ML (10ML) SYRINGE FOR IV PUSH (FOR BLOOD PRESSURE SUPPORT)
PREFILLED_SYRINGE | INTRAVENOUS | Status: DC | PRN
  Administered 2020-03-31 (×2): 80 ug via INTRAVENOUS

## 2020-03-31 MED ORDER — PROPOFOL 10 MG/ML IV BOLUS
INTRAVENOUS | Status: DC | PRN
  Administered 2020-03-31: 170 mg via INTRAVENOUS

## 2020-03-31 MED ORDER — DEXAMETHASONE SODIUM PHOSPHATE 10 MG/ML IJ SOLN
INTRAMUSCULAR | Status: DC | PRN
  Administered 2020-03-31: 5 mg via INTRAVENOUS

## 2020-03-31 MED ORDER — SODIUM CHLORIDE 0.9 % IR SOLN
Status: DC | PRN
  Administered 2020-03-31: 1000 mL

## 2020-03-31 MED ORDER — CEFAZOLIN SODIUM 1 G IJ SOLR
INTRAMUSCULAR | Status: AC
  Filled 2020-03-31: qty 30

## 2020-03-31 SURGICAL SUPPLY — 46 items
APPLIER CLIP 5 13 M/L LIGAMAX5 (MISCELLANEOUS)
CANISTER SUCT 3000ML PPV (MISCELLANEOUS) ×2 IMPLANT
CHLORAPREP W/TINT 26 (MISCELLANEOUS) ×3 IMPLANT
CLIP APPLIE 5 13 M/L LIGAMAX5 (MISCELLANEOUS) IMPLANT
COVER SURGICAL LIGHT HANDLE (MISCELLANEOUS) ×3 IMPLANT
COVER WAND RF STERILE (DRAPES) ×1 IMPLANT
DECANTER SPIKE VIAL GLASS SM (MISCELLANEOUS) ×3 IMPLANT
DERMABOND ADVANCED (GAUZE/BANDAGES/DRESSINGS) ×2
DERMABOND ADVANCED .7 DNX12 (GAUZE/BANDAGES/DRESSINGS) ×1 IMPLANT
DRAPE UTILITY XL STRL (DRAPES) ×2 IMPLANT
ELECT REM PT RETURN 9FT ADLT (ELECTROSURGICAL) ×3
ELECTRODE REM PT RTRN 9FT ADLT (ELECTROSURGICAL) ×1 IMPLANT
GLOVE BIOGEL PI IND STRL 7.0 (GLOVE) IMPLANT
GLOVE BIOGEL PI INDICATOR 7.0 (GLOVE) ×2
GLOVE SURG SIGNA 7.5 PF LTX (GLOVE) ×1 IMPLANT
GLOVE SURG SS PI 7.0 STRL IVOR (GLOVE) ×2 IMPLANT
GOWN STRL REUS W/ TWL LRG LVL3 (GOWN DISPOSABLE) ×2 IMPLANT
GOWN STRL REUS W/ TWL XL LVL3 (GOWN DISPOSABLE) ×1 IMPLANT
GOWN STRL REUS W/TWL LRG LVL3 (GOWN DISPOSABLE) ×9
GOWN STRL REUS W/TWL XL LVL3 (GOWN DISPOSABLE)
GRASPER SUT TROCAR 14GX15 (MISCELLANEOUS) ×2 IMPLANT
HANDLE SUCTION POOLE (INSTRUMENTS) IMPLANT
KIT BASIN OR (CUSTOM PROCEDURE TRAY) ×3 IMPLANT
KIT TURNOVER KIT B (KITS) ×3 IMPLANT
NS IRRIG 1000ML POUR BTL (IV SOLUTION) ×3 IMPLANT
PAD ARMBOARD 7.5X6 YLW CONV (MISCELLANEOUS) ×7 IMPLANT
PENCIL SMOKE EVACUATOR (MISCELLANEOUS) ×2 IMPLANT
SCISSORS LAP 5X35 DISP (ENDOMECHANICALS) ×3 IMPLANT
SET IRRIG TUBING LAPAROSCOPIC (IRRIGATION / IRRIGATOR) ×2 IMPLANT
SET TUBE SMOKE EVAC HIGH FLOW (TUBING) ×3 IMPLANT
SLEEVE ENDOPATH XCEL 5M (ENDOMECHANICALS) ×7 IMPLANT
SUCTION POOLE HANDLE (INSTRUMENTS) ×3
SUT MON AB 4-0 PC3 18 (SUTURE) ×3 IMPLANT
SUT VIC AB 3-0 SH 27 (SUTURE) ×6
SUT VIC AB 3-0 SH 27X BRD (SUTURE) IMPLANT
TOWEL GREEN STERILE (TOWEL DISPOSABLE) ×3 IMPLANT
TOWEL GREEN STERILE FF (TOWEL DISPOSABLE) ×3 IMPLANT
TRAY FOLEY SLVR 16FR LF STAT (SET/KITS/TRAYS/PACK) ×2 IMPLANT
TRAY LAPAROSCOPIC MC (CUSTOM PROCEDURE TRAY) ×3 IMPLANT
TROCAR XCEL 12X100 BLDLESS (ENDOMECHANICALS) ×2 IMPLANT
TROCAR XCEL BLUNT TIP 100MML (ENDOMECHANICALS) IMPLANT
TROCAR XCEL NON-BLD 5MMX100MML (ENDOMECHANICALS) ×3 IMPLANT
TUBE CONNECTING 12'X1/4 (SUCTIONS) ×1
TUBE CONNECTING 12X1/4 (SUCTIONS) ×1 IMPLANT
WATER STERILE IRR 1000ML POUR (IV SOLUTION) ×3 IMPLANT
YANKAUER SUCT BULB TIP NO VENT (SUCTIONS) ×2 IMPLANT

## 2020-03-31 NOTE — Progress Notes (Signed)
  Date: 03/31/2020  Patient name: Dwayne Lawrence  Medical record number: [id number]  Date of birth: June 07, 1956        I have seen and evaluated this patient and I have discussed the plan of care with the house staff. Please see Dr. Chase Picket note for complete details. I concur with her findings and plan.    Sid Falcon, MD 03/31/2020, 8:34 PM

## 2020-03-31 NOTE — Transfer of Care (Signed)
Immediate Anesthesia Transfer of Care Note  Patient: Dwayne Lawrence  Procedure(s) Performed: LAPAROSCOPIC LYSIS OF ADHESIONS (N/A Abdomen) LAPAROSCOPIC SMALL BOWEL REPAIR TIMES TWO (N/A Abdomen)  Patient Location: PACU  Anesthesia Type:General  Level of Consciousness: drowsy  Airway & Oxygen Therapy: Patient Spontanous Breathing and Patient connected to face mask oxygen  Post-op Assessment: Report given to RN and Post -op Vital signs reviewed and stable  Post vital signs: Reviewed  Last Vitals:  Vitals Value Taken Time  BP 148/95 03/31/20 1038  Temp 36.8 C 03/31/20 1035  Pulse 88 03/31/20 1039  Resp 10 03/31/20 1039  SpO2 94 % 03/31/20 1039  Vitals shown include unvalidated device data.  Last Pain:  Vitals:   03/31/20 1035  TempSrc:   PainSc: 8       Patients Stated Pain Goal: 4 ([id number])  Complications: No apparent anesthesia complications

## 2020-03-31 NOTE — Progress Notes (Addendum)
NAME:  Dwayne Lawrence, MRN:  [id number], DOB:  1956/08/21, LOS: 4 ADMISSION DATE:  03/28/2020   Brief History  64 yo male who recently underwent an uncomplicated total left hip arthroplasty on 5/4 by Dr. Ninfa Linden and was subsequently discharged on 5/5. Pt developed progressive n/v and abdominal distension since 5/6 and presented to [facility] on 5/10 and was found to have a SBO. Last BM was on 5/3. Last flatus on 5/9. Pt reported having only took one day's worth of pain medication at home.  Subjective/Interm history  No overnight events. No complaints this morning  Significant Hospital Events   5/10>hospital admission 5/11>no significant changes in discomfort/nausea. NG suction turned up. Starting compazine and changing zofran to phenergan for nausea. 5/12>no significant changes on abd xray this morning. Symptoms improved. Having dark fecal appearing NG output 5/13> laparoscopy for SBO 5/14> restarting anticoagulation.  Still having fairly significant NG output  Objective   Blood pressure 109/71, pulse 99, temperature 98.5 F (36.9 C), temperature source Oral, resp. rate 20, height 6\' 1"  (1.854 m), weight 124.7 kg, SpO2 100 %.     Intake/Output Summary (Last 24 hours) at 04/01/2020 1201 Last data filed at 04/01/2020 0845 Gross per 24 hour  Intake 30 ml  Output 1500 ml  Net -1470 ml   Filed Weights   03/28/20 0528 03/28/20 1900  Weight: 124.7 kg 124.7 kg    Examination: GENERAL: ill appearing in NAD CARDIAC: heart RRR. PULMONARY: Lungs clear ABDOMEN: No bowel sounds appreciated.  NG in place.  Abdomen mildly tender to palpation.  Consults:  General surgery Ortho   Significant Diagnostic Tests:  5/10 CT a/p: SBO with transition zone in the mid jejuna level slightly to the right of the midline in the mid abdomen. No free air. Somewhat swirl like appearance of the mid abd mesentary and vessels in mid abdomen near site of SBO--suspect internal hernia. No frank volvulus.  Micro  Data:  n/a  Antimicrobials:  n/a  Summary  64 yo male who underwent an uncomplicated LTHA on 5/4 and developed progressive abd distension, n/v who was admitted to IMTS on 5/10 for medical management of SBO. Resolved hospital problems  AKI Assessment & Plan:  Active Problems:   Small bowel obstruction (HCC)  SBO s/p LTHA. POD #1 laproscopic lysis of adhesions  Electrolytes stable. Remains afebrile. Reactive leukocytosis. Plan --Continue IVF with LR  --Monitor electrolytes --Every 8 hours Robaxin IV  --NPO  History of PE   Plan: Resume heparin drip  Chronic/Stable medical problems   Left Total Hip Arthroplasty (03/22/20). Ortho consulted on admission. WBAT. No hip precautions. HTN. Blood pressures stable.  Best practice:  CODE STATUS: FULL Diet: NPO DVT for prophylaxis: heparin gtt  Dispo: pending SBO medical management  Mitzi Hansen, MD [place] PGY-1 PAGER #: [phone] 04/01/20  12:01 PM  Labs    CBC Latest Ref Rng & Units 04/01/2020 03/31/2020 03/30/2020  WBC 4.0 - 10.5 K/uL 11.9(H) 9.8 7.2  Hemoglobin 13.0 - 17.0 g/dL 11.4(L) 11.6(L) 11.6(L)  Hematocrit 39.0 - 52.0 % 36.2(L) 37.0(L) 36.9(L)  Platelets 150 - 400 K/uL 175 242 219   BMP Latest Ref Rng & Units 04/01/2020 03/31/2020 03/30/2020  Glucose 70 - 99 mg/dL 138(H) 105(H) 117(H)  BUN 8 - 23 mg/dL 21 27(H) 29(H)  Creatinine 0.61 - 1.24 mg/dL 1.35(H) 1.18 1.23  Sodium 135 - 145 mmol/L 147(H) 144 143  Potassium 3.5 - 5.1 mmol/L 3.8 3.7 3.7  Chloride 98 - 111 mmol/L 111 107  103  CO2 22 - 32 mmol/L 25 23 27   Calcium 8.9 - 10.3 mg/dL 8.3(L) 8.8(L) 8.9

## 2020-03-31 NOTE — Progress Notes (Signed)
OT Cancellation Note  Patient Details Name: Dwayne Lawrence MRN: [id number] DOB: 07/23/1956   Cancelled Treatment:    Reason Eval/Treat Not Completed: Patient at procedure or test/ unavailable. Pt currently off the floor undergoing laparotomy for surgical exploration and lysis of adhesions. OT will follow up tomorrow as time allows.   Mauri [place] 03/31/2020, 7:26 AM

## 2020-03-31 NOTE — Progress Notes (Signed)
Pre Procedure note for inpatients:   Dwayne Lawrence has been scheduled for Procedure(s): LAPAROSCOPIC LYSIS OF ADHESIONS; POSSIBLE OPEN (N/A) today. The various methods of treatment have been discussed with the patient. After consideration of the risks, benefits and treatment options the patient has consented to the planned procedure.   The patient has been seen and labs reviewed. There are no changes in the patient's condition to prevent proceeding with the planned procedure today.  Recent labs:  Lab Results  Component Value Date   WBC 9.8 03/31/2020   HGB 11.6 (L) 03/31/2020   HCT 37.0 (L) 03/31/2020   PLT 242 03/31/2020   GLUCOSE 105 (H) 03/31/2020   ALT 55 (H) 03/29/2020   AST 40 03/29/2020   NA 144 03/31/2020   K 3.7 03/31/2020   CL 107 03/31/2020   CREATININE 1.18 03/31/2020   BUN 27 (H) 03/31/2020   CO2 23 03/31/2020   INR 1.5 (H) 03/29/2020   HGBA1C 6.0 (H) 03/28/2020    Mickeal Skinner, MD 03/31/2020 6:57 AM

## 2020-03-31 NOTE — Anesthesia Postprocedure Evaluation (Signed)
Anesthesia Post Note  Patient: Dwayne Lawrence  Procedure(s) Performed: LAPAROSCOPIC LYSIS OF ADHESIONS (N/A Abdomen) LAPAROSCOPIC SMALL BOWEL REPAIR TIMES TWO (N/A Abdomen)     Patient location during evaluation: PACU Anesthesia Type: General Level of consciousness: awake and alert and oriented Pain management: pain level controlled Vital Signs Assessment: post-procedure vital signs reviewed and stable Respiratory status: spontaneous breathing, nonlabored ventilation and respiratory function stable Cardiovascular status: blood pressure returned to baseline Postop Assessment: no apparent nausea or vomiting Anesthetic complications: no    Last Vitals:  Vitals:   03/31/20 1150 03/31/20 1213  BP: 139/81 (!) 141/85  Pulse: 96 99  Resp: 13 13  Temp:  36.5 C  SpO2: 100% 100%    Last Pain:  Vitals:   03/31/20 1213  TempSrc: Oral  PainSc:                  Brennan Bailey

## 2020-03-31 NOTE — Progress Notes (Signed)
PT Cancellation Note  Patient Details Name: Dwayne Lawrence MRN: [id number] DOB: 19-Nov-1956   Cancelled Treatment:    Reason Eval/Treat Not Completed: Patient at procedure or test/unavailable. Pt in OR. Will follow up tomorrow.   [place] 03/31/2020, 8:30 AM [place] Pager [phone] Office [phone]

## 2020-03-31 NOTE — Op Note (Signed)
Preoperative diagnosis: small bowel obstruction  Postoperative diagnosis: same   Procedure: laparoscopic lysis of adhesions, enterrorhaphy x 2  Surgeon: Gurney Maxin, M.D.  Asst: Ewell Poe RNFA  Anesthesia: general  Indications for procedure: Dwayne Lawrence is a 64 y.o. year old male with symptoms of nausea and vomiting with findings consistent with small bowel obstruction. He underwent small bowel protocol without improvement.  Description of procedure: The patient was brought into the operative suite. Anesthesia was administered with General endotracheal anesthesia. WHO checklist was applied. The patient was then placed in supine position. The area was prepped and draped in the usual sterile fashion.  A left subcostal incision was made. A 40mm trocar was used to gain access to the peritoneal cavity by optical entry technique. Pneumoperitoneum was applied with a high flow and low pressure. The laparoscope was reinserted to confirm position.  3 additional 5 mm trocars were placed; one in the left mid abdomen, one in the left lower abdomen, one in the supraumbilical area. Bilateral TAP blocks were placed with Marcaine. The cecum was identified there was a large amount of adhesions in the right lower quadrant. 90 minutes of sharp and blunt lysis of adhesions occurred to slowly work the distal intestine moving proximally. There were multiple adhesions to the sigmoid colon and retroperitoneum that was the location transition point. Once completely free there were no other areas of kinking or obstruction. The small bowel was run retrograde to look for injury to the intestine. There were 2 pin hole full thickness injuries in the proximal dilated intestine. The left mid abdomen incision was increased to a 12 mm trocar to allow intracorporeal repair. These were repaired primarily with interrupted 3-0 vicryl and then lembert sutures were placed with 3-0 vicryl. The intestine was run additional times  and no other injuries were identified.  The 12 mm trocar was closed with 0 vicryl with suture passer. The pneumoperitoneum was evacuated. All trocar sites were closed with 4-0 monocryl in interrupted fashion. Dermabond was put in place for dressing.  Findings: adhesive bowel obstruction, full thickness injury of small intestine x 2 with successful repair  Specimen: none  Implant: none   Blood loss: 30 ml  Local anesthesia: 30 ml marcaine   Complications: none  Gurney Maxin, M.D. General, Bariatric, & Minimally Invasive Surgery [facility]

## 2020-03-31 NOTE — Anesthesia Procedure Notes (Addendum)
Procedure Name: Intubation Date/Time: 03/31/2020 7:43 AM Performed by: Janene Harvey, CRNA Pre-anesthesia Checklist: Patient identified, Emergency Drugs available, Suction available and Patient being monitored Patient Re-evaluated:Patient Re-evaluated prior to induction Oxygen Delivery Method: Circle system utilized Preoxygenation: Pre-oxygenation with 100% oxygen Induction Type: IV induction, Rapid sequence and Cricoid Pressure applied Ventilation: Mask ventilation without difficulty Laryngoscope Size: Glidescope and 3 Grade View: Grade I Tube type: Oral Tube size: 7.5 mm Number of attempts: 1 Airway Equipment and Method: Stylet and Oral airway Placement Confirmation: ETT inserted through vocal cords under direct vision,  positive ETCO2 and breath sounds checked- equal and bilateral Secured at: 22 cm Tube secured with: Tape Dental Injury: Teeth and Oropharynx as per pre-operative assessment  Comments: RSI SBO, NGT to suction prior to induction. Elective glidescope, small mouth opening, hx glide intubation

## 2020-04-01 LAB — CBC
HCT: 36.2 % — ABNORMAL LOW (ref 39.0–52.0)
Hemoglobin: 11.4 g/dL — ABNORMAL LOW (ref 13.0–17.0)
MCH: 29.4 pg (ref 26.0–34.0)
MCHC: 31.5 g/dL (ref 30.0–36.0)
MCV: 93.3 fL (ref 80.0–100.0)
Platelets: 175 10*3/uL (ref 150–400)
RBC: 3.88 MIL/uL — ABNORMAL LOW (ref 4.22–5.81)
RDW: 14.2 % (ref 11.5–15.5)
WBC: 11.9 10*3/uL — ABNORMAL HIGH (ref 4.0–10.5)
nRBC: 0 % (ref 0.0–0.2)

## 2020-04-01 LAB — GLUCOSE, CAPILLARY
Glucose-Capillary: 104 mg/dL — ABNORMAL HIGH (ref 70–99)
Glucose-Capillary: 111 mg/dL — ABNORMAL HIGH (ref 70–99)
Glucose-Capillary: 113 mg/dL — ABNORMAL HIGH (ref 70–99)
Glucose-Capillary: 115 mg/dL — ABNORMAL HIGH (ref 70–99)
Glucose-Capillary: 126 mg/dL — ABNORMAL HIGH (ref 70–99)
Glucose-Capillary: 131 mg/dL — ABNORMAL HIGH (ref 70–99)

## 2020-04-01 LAB — BASIC METABOLIC PANEL
Anion gap: 11 (ref 5–15)
BUN: 21 mg/dL (ref 8–23)
CO2: 25 mmol/L (ref 22–32)
Calcium: 8.3 mg/dL — ABNORMAL LOW (ref 8.9–10.3)
Chloride: 111 mmol/L (ref 98–111)
Creatinine, Ser: 1.35 mg/dL — ABNORMAL HIGH (ref 0.61–1.24)
GFR calc Af Amer: >60 mL/min (ref >60)
GFR calc non Af Amer: 55 mL/min — ABNORMAL LOW (ref >60)
Glucose, Bld: 138 mg/dL — ABNORMAL HIGH (ref 70–99)
Potassium: 3.8 mmol/L (ref 3.5–5.1)
Sodium: 147 mmol/L — ABNORMAL HIGH (ref 135–145)

## 2020-04-01 LAB — HEPARIN LEVEL (UNFRACTIONATED)
Heparin Unfractionated: <0.1 IU/mL — ABNORMAL LOW (ref 0.30–0.70)
Heparin Unfractionated: 0.14 IU/mL — ABNORMAL LOW (ref 0.30–0.70)

## 2020-04-01 MED ORDER — HEPARIN (PORCINE) 25000 UT/250ML-% IV SOLN
1950.0000 [IU]/h | INTRAVENOUS | Status: DC
  Administered 2020-04-01: 1400 [IU]/h via INTRAVENOUS
  Administered 2020-04-02: 1750 [IU]/h via INTRAVENOUS
  Administered 2020-04-03 – 2020-04-04 (×3): 1850 [IU]/h via INTRAVENOUS
  Filled 2020-04-01 (×6): qty 250

## 2020-04-01 MED ORDER — METHOCARBAMOL 1000 MG/10ML IJ SOLN
1000.0000 mg | Freq: Three times a day (TID) | INTRAVENOUS | Status: DC
  Administered 2020-04-01 – 2020-04-03 (×6): 1000 mg via INTRAVENOUS
  Filled 2020-04-01 (×15): qty 10

## 2020-04-01 MED ORDER — ACETAMINOPHEN 10 MG/ML IV SOLN
1000.0000 mg | Freq: Four times a day (QID) | INTRAVENOUS | Status: AC
  Administered 2020-04-01 – 2020-04-02 (×4): 1000 mg via INTRAVENOUS
  Filled 2020-04-01 (×4): qty 100

## 2020-04-01 MED ORDER — HYDROMORPHONE HCL 1 MG/ML IJ SOLN: 0.5000 mg | INTRAMUSCULAR | Status: DC | PRN

## 2020-04-01 NOTE — Evaluation (Signed)
Physical Therapy Evaluation Patient Details Name: Dwayne Lawrence MRN: [id number] DOB: [birth year] Today's Date: 04/01/2020   History of Present Illness  64 y.o male with a history of prostate cancer s/p resection, HTN, gout, prior DVT/PE on warfarin, and arthritis who recently underwent L total hip arthroplasty on 03/22/20 who presented to the ED with progressive N/V and abdominal pain since shortly after discharge on 03/23/20. . s/p laparoscopic lysis of adhesions, enterrohaphy x2 on 5/13.  Clinical Impression   Pt presents with generalized weakness L>R post-operatively, significant abdominal pain, impaired standing balance, and decreased activity tolerance. Pt to benefit from acute PT to address deficits. Pt ambulated short hallway distance with use of RW and close guard +2, pt limited by dyspnea on exertion and fatigue. PT recommending HHPT to address mobility deficits post-acutely, anticipating pt will progress well due to motivation to return to PLOF. PT to progress mobility as tolerated, and will continue to follow acutely.      Follow Up Recommendations Supervision for mobility/OOB;Home health PT    Equipment Recommendations  None recommended by PT    Recommendations for Other Services       Precautions / Restrictions Precautions Precautions: Fall Restrictions Weight Bearing Restrictions: No LLE Weight Bearing: Weight bearing as tolerated      Mobility  Bed Mobility Overal bed mobility: Needs Assistance Bed Mobility: Sit to Supine     Supine to sit: Min assist;+2 for physical assistance     General bed mobility comments: for trunk elevation, LLE lifting, and lines/leads  Transfers Overall transfer level: Needs assistance Equipment used: Rolling walker (2 wheeled) Transfers: Sit to/from Stand Sit to Stand: Min assist;Min guard;+2 safety/equipment         General transfer comment: min assist for rise and steady, +2 for lines/leads. Verbal cuing for safe hand placement  when rising.  Ambulation/Gait Ambulation/Gait assistance: Min guard;+2 safety/equipment;+2 physical assistance Gait Distance (Feet): 65 Feet Assistive device: Rolling walker (2 wheeled) Gait Pattern/deviations: Step-through pattern;Antalgic;Decreased stride length Gait velocity: decr   General Gait Details: min guard +2 for lines/leads, verbal cuing for placement in RW, upright posture. HRmax 120 bpm, with SpO2 90s% on RA during gait.  Stairs            Wheelchair Mobility    Modified Rankin (Stroke Patients Only)       Balance Overall balance assessment: Mild deficits observed, not formally tested                                           Pertinent Vitals/Pain Pain Assessment: Faces Faces Pain Scale: Hurts even more Pain Location: abdomen Pain Descriptors / Indicators: Sore;Pressure Pain Intervention(s): Limited activity within patient's tolerance;Monitored during session;Repositioned;Patient requesting pain meds-RN notified    Home Living Family/patient expects to be discharged to:: Private residence Living Arrangements: Spouse/significant other Available Help at Discharge: Family Type of Home: House Home Access: Stairs to enter Entrance Stairs-Rails: None Entrance Stairs-Number of Steps: 2-3 Home Layout: Able to live on main level with bedroom/bathroom Home Equipment: Environmental consultant - 2 wheels;Cane - single point;Bedside commode      Prior Function Level of Independence: Independent         Comments: Pt reports that he was totally independent, driving prior to hip surgery 03/22/20     Hand Dominance   Dominant Hand: Right    Extremity/Trunk Assessment   Upper Extremity Assessment Upper Extremity Assessment:  Defer to OT evaluation    Lower Extremity Assessment Lower Extremity Assessment: LLE deficits/detail;Generalized weakness LLE Deficits / Details: anticipated post-operative hip weakness s/p surgery 5/4; able to perform repeated quad  sets, heel slide LLE Sensation: WNL    Cervical / Trunk Assessment Cervical / Trunk Assessment: Normal  Communication   Communication: No difficulties  Cognition Arousal/Alertness: Awake/alert Behavior During Therapy: WFL for tasks assessed/performed Overall Cognitive Status: Within Functional Limits for tasks assessed                                        General Comments      Exercises Total Joint Exercises Ankle Circles/Pumps: AROM;Both;15 reps;Seated Quad Sets: Left;10 reps;Seated(in recliner) Heel Slides: Left;10 reps;Seated   Assessment/Plan    PT Assessment Patient needs continued PT services  PT Problem List Decreased strength;Decreased range of motion;Decreased activity tolerance;Decreased balance;Decreased mobility;Pain;Decreased knowledge of precautions;Decreased safety awareness;Obesity;Decreased knowledge of use of DME       PT Treatment Interventions DME instruction;Gait training;Stair training;Functional mobility training;Therapeutic activities;Therapeutic exercise;Balance training;Patient/family education    PT Goals (Current goals can be found in the Care Plan section)  Acute Rehab PT Goals Patient Stated Goal: make good progress with mobility again PT Goal Formulation: With patient Time For Goal Achievement: 04/22/20 Potential to Achieve Goals: Good    Frequency Min 3X/week   Barriers to discharge        Co-evaluation PT/OT/SLP Co-Evaluation/Treatment: Yes Reason for Co-Treatment: For patient/therapist safety;To address functional/ADL transfers PT goals addressed during session: Mobility/safety with mobility;Proper use of DME         AM-PAC PT "6 Clicks" Mobility  Outcome Measure Help needed turning from your back to your side while in a flat bed without using bedrails?: A Little Help needed moving from lying on your back to sitting on the side of a flat bed without using bedrails?: A Little Help needed moving to and from a  bed to a chair (including a wheelchair)?: A Little Help needed standing up from a chair using your arms (e.g., wheelchair or bedside chair)?: A Little Help needed to walk in hospital room?: A Little Help needed climbing 3-5 steps with a railing? : A Lot 6 Click Score: 17    End of Session Equipment Utilized During Treatment: Gait belt;Oxygen(2LO2 reapplied post-session, reading occasionall dipping into mid80s%) Activity Tolerance: Patient tolerated treatment well Patient left: with call bell/phone within reach;in bed;with family/visitor present Nurse Communication: Mobility status PT Visit Diagnosis: Other abnormalities of gait and mobility (R26.89);Muscle weakness (generalized) (M62.81)    Time: [id number] PT Time Calculation (min) (ACUTE ONLY): 30 min   Charges:   PT Evaluation $PT Eval Low Complexity: 1 Low         Myya Meenach E, PT Acute Rehabilitation Services Pager [phone]  Office [phone]   Atanacio Melnyk D Elonda Husky 04/01/2020, 12:10 PM

## 2020-04-01 NOTE — Evaluation (Signed)
Occupational Therapy Evaluation Patient Details Name: Dwayne Lawrence MRN: [id number] DOB: [birth year] Today's Date: 04/01/2020    History of Present Illness 64 y.o male with a history of prostate cancer s/p resection, HTN, gout, prior DVT/PE on warfarin, and arthritis who recently underwent L total hip arthroplasty on 03/22/20 who presented to the ED with progressive N/V and abdominal pain since shortly after discharge on 03/23/20. . s/p laparoscopic lysis of adhesions, enterrohaphy x2 on 5/13.   Clinical Impression   Pt presents with decline in function and safety with ADLs and ADL mobility with impaired balance and endurance. Prior to hip surgery 03/22/20, pt reports that he was independent with ADLs/selfcare, mobility and was riving; lives at home with his wife. Pt currently requires min A +2 for be mobility to sit EOB, min A - mig guard A for sit - stand/transfers, min guard A with grooming/UB ADLs, max A with LB ADLs and fatigued easily during ADL mobility with some SOB, although O2 SATs 97%. Pt would benefit from acute OT services to address impairments to maximize level of function and safety    Follow Up Recommendations  Home health OT    Equipment Recommendations  Other (comment)(reacher, LH bath sponge, sock aid)    Recommendations for Other Services       Precautions / Restrictions Precautions Precautions: None Restrictions Weight Bearing Restrictions: Yes LLE Weight Bearing: Weight bearing as tolerated      Mobility Bed Mobility Overal bed mobility: Needs Assistance Bed Mobility: Sit to Supine     Supine to sit: Min assist;+2 for physical assistance        Transfers Overall transfer level: Needs assistance Equipment used: Rolling walker (2 wheeled) Transfers: Sit to/from Stand Sit to Stand: Min assist;Min guard;+2 safety/equipment              Balance Overall balance assessment: Mild deficits observed, not formally tested                                          ADL either performed or assessed with clinical judgement   ADL Overall ADL's : Needs assistance/impaired     Grooming: Wash/dry hands;Wash/dry face;Standing;Min guard   Upper Body Bathing: Min guard   Lower Body Bathing: Maximal assistance   Upper Body Dressing : Min guard   Lower Body Dressing: Maximal assistance   Toilet Transfer: Minimal assistance;Min guard;+2 for safety/equipment;RW   Toileting- Clothing Manipulation and Hygiene: Minimal assistance       Functional mobility during ADLs: Minimal assistance;Min guard;+2 for safety/equipment;Rolling walker General ADL Comments: Pt and daughter educated on LB ADL A/E for home use with handout provided     Vision Patient Visual Report: No change from baseline       Perception     Praxis      Pertinent Vitals/Pain Pain Assessment: Faces Faces Pain Scale: Hurts little more Pain Location: abdomen Pain Descriptors / Indicators: Sore;Pressure Pain Intervention(s): Monitored during session;Repositioned;Patient requesting pain meds-RN notified     Hand Dominance Right   Extremity/Trunk Assessment Upper Extremity Assessment Upper Extremity Assessment: Overall WFL for tasks assessed   Lower Extremity Assessment Lower Extremity Assessment: Defer to PT evaluation   Cervical / Trunk Assessment Cervical / Trunk Assessment: Normal   Communication Communication Communication: No difficulties   Cognition Arousal/Alertness: Awake/alert Behavior During Therapy: WFL for tasks assessed/performed Overall Cognitive Status: Within Functional Limits for tasks assessed  General Comments       Exercises     Shoulder Instructions      Home Living Family/patient expects to be discharged to:: Private residence Living Arrangements: Spouse/significant other Available Help at Discharge: Family Type of Home: House Home Access: Stairs to enter [employer] of Steps: 2-3 Entrance Stairs-Rails: None Home Layout: Able to live on main level with bedroom/bathroom     Bathroom Shower/Tub: Tub/shower unit         Home Equipment: Environmental consultant - 2 wheels;Cane - single point;Bedside commode          Prior Functioning/Environment Level of Independence: Independent        Comments: Pt reports that he was totally independent, driving prior to hip sx 03/22/20        OT Problem List: Impaired balance (sitting and/or standing);Decreased activity tolerance;Decreased knowledge of use of DME or AE      OT Treatment/Interventions: Self-care/ADL training;Therapeutic exercise;Therapeutic activities;DME and/or AE instruction    OT Goals(Current goals can be found in the care plan section) Acute Rehab OT Goals Patient Stated Goal: "go home." OT Goal Formulation: With patient/family Time For Goal Achievement: 04/15/20 Potential to Achieve Goals: Good ADL Goals Pt Will Perform Grooming: with supervision;with set-up;standing;with caregiver independent in assisting Pt Will Perform Upper Body Bathing: with set-up;sitting;with caregiver independent in assisting Pt Will Perform Lower Body Bathing: with mod assist;with adaptive equipment;with caregiver independent in assisting Pt Will Perform Upper Body Dressing: with set-up;with caregiver independent in assisting;sitting Pt Will Perform Lower Body Dressing: with mod assist;with adaptive equipment;with caregiver independent in assisting Pt Will Transfer to Toilet: with min guard assist;with supervision;ambulating;grab bars Pt Will Perform Toileting - Clothing Manipulation and hygiene: with min guard assist;sit to/from stand  OT Frequency: Min 2X/week   Barriers to D/C:            Co-evaluation              AM-PAC OT "6 Clicks" Daily Activity     Outcome Measure Help from another person eating meals?: Total(NPO) Help from another person taking care of personal grooming?: A Little Help  from another person toileting, which includes using toliet, bedpan, or urinal?: A Little Help from another person bathing (including washing, rinsing, drying)?: A Lot Help from another person to put on and taking off regular upper body clothing?: A Little Help from another person to put on and taking off regular lower body clothing?: A Lot 6 Click Score: 14   End of Session Equipment Utilized During Treatment: Rolling walker  Activity Tolerance: Patient limited by fatigue Patient left: in chair;with call bell/phone within reach;with chair alarm set;with family/visitor present  OT Visit Diagnosis: Unsteadiness on feet (R26.81);Other abnormalities of gait and mobility (R26.89);Pain Pain - part of body: (abdominal area)                Time: [id number] OT Time Calculation (min): 31 min Charges:  OT General Charges $OT Visit: 1 Visit OT Evaluation $OT Eval Moderate Complexity: 1 Mod    Britt Bottom 04/01/2020, 11:27 AM

## 2020-04-01 NOTE — Progress Notes (Signed)
[place] for heparin Indication: hx VTE  Allergies  Allergen Reactions  . Iodine Hives    Patient Measurements: Height: 6\' 1"  (185.4 cm) Weight: 124.7 kg (274 lb 14.6 oz) IBW/kg (Calculated) : 79.9 Heparin Dosing Weight: 107.3kg  Vital Signs: Temp: 98.5 F (36.9 C) (05/14 1148) Temp Source: Oral (05/14 1148) BP: 109/71 (05/14 1148) Pulse Rate: 99 (05/14 1148)  Labs: Recent Labs    03/29/20 1913 03/30/20 0402 03/30/20 0402 03/31/20 0304 04/01/20 0440 04/01/20 0657  HGB  --  11.6*   < > 11.6* 11.4*  --   HCT  --  36.9*  --  37.0* 36.2*  --   PLT  --  219  --  242 175  --   HEPARINUNFRC 0.72* 0.51  --   --  <0.10*  --   CREATININE  --  1.23  --  1.18  --  1.35*   < > = values in this interval not displayed.    Estimated Creatinine Clearance: 75.5 mL/min (A) (by C-G formula based on SCr of 1.35 mg/dL (H)).   Assessment: 89 yom presented to the ED with abdominal pain, found to have a SBO. He is on chronic warfarin for history for VTE.  Patient had surgery on 5/4 and Pharmacy asked to dose IV heparin.  S/p lysis of adhesions/enterotomy, now to restart heparin gtt.  Last therapeutic rate of 1400 units/hr.    Goal of Therapy:  Heparin level 0.3-0.7 units/ml Monitor platelets by anticoagulation protocol: Yes   Plan:  Restart heparin gtt at 1400 units/hr F/u 6 hour heparin level  Bertis Ruddy, PharmD Clinical Pharmacist Please check AMION for all [place] numbers 04/01/2020 12:14 PM

## 2020-04-01 NOTE — Progress Notes (Signed)
Patient ID: Dwayne Lawrence, male   DOB: May 22, 1956, 64 y.o.   MRN: [id number]    1 Day Post-Op  Subjective: Patient having some pain this morning.  No flatus.  NGT working well.  Wants to get up with therapies today.  ROS: See above, otherwise other systems negative  Objective: Vital signs in last 24 hours: Temp:  [97.7 F (36.5 C)-98.7 F (37.1 C)] 98.4 F (36.9 C) (05/14 0803) Pulse Rate:  [87-105] 92 (05/14 0803) Resp:  [10-19] 13 (05/14 0803) BP: (121-174)/(80-106) 134/81 (05/14 0803) SpO2:  [93 %-100 %] 100 % (05/14 0803) Last BM Date: 03/22/20  Intake/Output from previous day: 05/13 0701 - 05/14 0700 In: 2110 [I.V.:2000; NG/GT:60; IV Piggyback:50] Out: [id number] [Urine:1675; Emesis/NG output:150; Blood:20] Intake/Output this shift: No intake/output data recorded.  PE: Abd: soft, appropriately tender, absent BS, incisions c/d/i with dermabond present, NGT in place with bilious output GU: foley in place with clear yellow urine  Lab Results:  Recent Labs    03/31/20 0304 04/01/20 0440  WBC 9.8 11.9*  HGB 11.6* 11.4*  HCT 37.0* 36.2*  PLT 242 175   BMET Recent Labs    03/31/20 0304 04/01/20 0657  NA 144 147*  K 3.7 3.8  CL 107 111  CO2 23 25  GLUCOSE 105* 138*  BUN 27* 21  CREATININE 1.18 1.35*  CALCIUM 8.8* 8.3*   PT/INR No results for input(s): LABPROT, INR in the last 72 hours. CMP     Component Value Date/Time   NA 147 (H) 04/01/2020 0657   K 3.8 04/01/2020 0657   CL 111 04/01/2020 0657   CO2 25 04/01/2020 0657   GLUCOSE 138 (H) 04/01/2020 0657   BUN 21 04/01/2020 0657   CREATININE 1.35 (H) 04/01/2020 0657   CALCIUM 8.3 (L) 04/01/2020 0657   PROT 6.9 03/29/2020 0312   ALBUMIN 3.2 (L) 03/29/2020 0312   AST 40 03/29/2020 0312   ALT 55 (H) 03/29/2020 0312   ALKPHOS 66 03/29/2020 0312   BILITOT 1.2 03/29/2020 0312   GFRNONAA 55 (L) 04/01/2020 0657   GFRAA >60 04/01/2020 0657   Lipase     Component Value Date/Time   LIPASE 56 (H) 03/28/2020  0546       Studies/Results: DG Abd Portable 1V  Result Date: 03/31/2020 CLINICAL DATA:  Small bowel obstruction. EXAM: PORTABLE ABDOMEN - 1 VIEW COMPARISON:  Abdominal x-ray from yesterday. FINDINGS: Diffusely dilated air-filled small bowel is similar to prior study. No radio-opaque calculi or other significant radiographic abnormality are seen. IMPRESSION: Unchanged small bowel obstruction. Electronically Signed   By: Titus Dubin M.D.   On: 03/31/2020 08:44    Anti-infectives: Anti-infectives (From admission, onward)   None       Assessment/Plan HTN AKI - likely from dehydration, will increase fluids to 125 today, but UOP is good. H/o prostate cancer H/o DVT/PE - on coumadin, which is being held, on heparin gtt Gout 1 week s/p L THA  POD 1, s/p dx laparoscopy with LOA and enterotomy repair x2 for SBO, Dr. Kieth Brightly 5/13 -having pain, will add multimodal pain control  -cont NGT and await bowel function -DC foley -mobilize at least TID and up in chair -incentive spirometer   FEN -NPO/NGT VTE -coumadin on hold,heparin gtt ID -preop dose given, otherwise none   LOS: 4 days    Henreitta Cea , [facility] 04/01/2020, 9:51 AM Please see Amion for pager number during day hours 7:00am-4:30pm or 7:00am -11:30am on weekends

## 2020-04-01 NOTE — Progress Notes (Addendum)
NAME:  Dwayne Lawrence, MRN:  [id number], DOB:  Aug 27, 1956, LOS: 4 ADMISSION DATE:  03/28/2020   Brief History  64 yo male who recently underwent an uncomplicated total left hip arthroplasty on 5/4 by Dr. Ninfa Linden and was subsequently discharged on 5/5. Pt developed progressive n/v and abdominal distension since 5/6 and presented to [facility] on 5/10 and was found to have a SBO. Last BM was on 5/3. Last flatus on 5/9. Pt reported having only took one day's worth of pain medication at home.  Subjective/Interm history  No overnight events. Seen in [facility] Events   5/10>hospital admission 5/11>no significant changes in discomfort/nausea. NG suction turned up. Starting compazine and changing zofran to phenergan for nausea. 5/12>no significant changes on abd xray this morning. Symptoms improved. Having dark fecal appearing NG output 5/13> laparoscopy for SBO   Objective   Blood pressure 109/71, pulse 99, temperature 98.5 F (36.9 C), temperature source Oral, resp. rate 20, height 6\' 1"  (1.854 m), weight 124.7 kg, SpO2 100 %.     Intake/Output Summary (Last 24 hours) at 04/01/2020 1307 Last data filed at 04/01/2020 0845 Gross per 24 hour  Intake 30 ml  Output 1500 ml  Net -1470 ml   Filed Weights   03/28/20 0528 03/28/20 1900  Weight: 124.7 kg 124.7 kg    Examination: GENERAL: ill appearing in NAD CARDIAC: RRR PULMONARY: upper respiratory sounds ABDOMEN: 1cm laproscopic incisions present. abd firm.  Consults:  General surgery Ortho   Significant Diagnostic Tests:  5/10 CT a/p: SBO with transition zone in the mid jejuna level slightly to the right of the midline in the mid abdomen. No free air. Somewhat swirl like appearance of the mid abd mesentary and vessels in mid abdomen near site of SBO--suspect internal hernia. No frank volvulus.  Micro Data:  n/a  Antimicrobials:  n/a  Summary  64 yo male who underwent an uncomplicated LTHA on 5/4 and developed  progressive abd distension, n/v who was admitted to [facility] on 5/10 for medical management of SBO. Resolved hospital problems  AKI Assessment & Plan:  Active Problems:   Small bowel obstruction (HCC)  SBO s/p LTHA. s/p laproscopic lysis of adhesions 5/13 No improvement on this morning's imaging>taken to OR Electrolytes stable. Remains afebrile. Reactive leukocytosis. Plan --Continue IVF with LR  --continue NG to suction --Monitor electrolytes --Every 8 hours Robaxin IV  --NPO  History of PE   Plan: resume heparin drip when ok'd by surgery  Chronic/Stable medical problems   Left Total Hip Arthroplasty (03/22/20). Ortho consulted on admission. WBAT. No hip precautions. HTN. Blood pressures stable.  Best practice:  CODE STATUS: FULL Diet: NPO DVT for prophylaxis: heparin gtt when ok'd by surgery Dispo: pending SBO medical management  Mitzi Hansen, MD [place] PGY-1 PAGER #: [phone] 03/31/20 1:07 PM  Labs    CBC Latest Ref Rng & Units 04/01/2020 03/31/2020 03/30/2020  WBC 4.0 - 10.5 K/uL 11.9(H) 9.8 7.2  Hemoglobin 13.0 - 17.0 g/dL 11.4(L) 11.6(L) 11.6(L)  Hematocrit 39.0 - 52.0 % 36.2(L) 37.0(L) 36.9(L)  Platelets 150 - 400 K/uL 175 242 219   BMP Latest Ref Rng & Units 04/01/2020 03/31/2020 03/30/2020  Glucose 70 - 99 mg/dL 138(H) 105(H) 117(H)  BUN 8 - 23 mg/dL 21 27(H) 29(H)  Creatinine 0.61 - 1.24 mg/dL 1.35(H) 1.18 1.23  Sodium 135 - 145 mmol/L 147(H) 144 143  Potassium 3.5 - 5.1 mmol/L 3.8 3.7 3.7  Chloride 98 - 111 mmol/L 111 107 103  CO2 22 - 32 mmol/L 25 23 27   Calcium 8.9 - 10.3 mg/dL 8.3(L) 8.8(L) 8.9

## 2020-04-01 NOTE — Progress Notes (Addendum)
[facility] for heparin Indication: hx VTE  Allergies  Allergen Reactions  . Iodine Hives    Patient Measurements: Height: 6\' 1"  (185.4 cm) Weight: 124.7 kg (274 lb 14.6 oz) IBW/kg (Calculated) : 79.9 Heparin Dosing Weight: 107.3kg  Vital Signs: Temp: 98.5 F (36.9 C) (05/14 1148) Temp Source: Oral (05/14 1148) BP: 132/78 (05/14 1546) Pulse Rate: 96 (05/14 1546)  Labs: Recent Labs    03/30/20 0402 03/30/20 0402 03/31/20 0304 04/01/20 0440 04/01/20 0657 04/01/20 1859  HGB 11.6*   < > 11.6* 11.4*  --   --   HCT 36.9*  --  37.0* 36.2*  --   --   PLT 219  --  242 175  --   --   HEPARINUNFRC 0.51  --   --  <0.10*  --  0.14*  CREATININE 1.23  --  1.18  --  1.35*  --    < > = values in this interval not displayed.    Estimated Creatinine Clearance: 75.5 mL/min (A) (by C-G formula based on SCr of 1.35 mg/dL (H)).   Assessment: 25 yom presented to the ED with abdominal pain, found to have a SBO. He is on chronic warfarin for history for VTE.  Patient had surgery on 5/4 and Pharmacy asked to dose IV heparin.  S/p lysis of adhesions/enterotomy,  Restarted heparin gtt at 1400 units/hr.  ~6 hour heparin level is 0.14, subtherapeutic.  Previously was therapeutic on heparin rate of 1400 units/hr.   RN notes that on prior shift, drain tube reported by family to be a little more reddish that usual brownish. Current RN checked drainage now and reports it looks appropriate. RN will watch and report if any bleeding.  Goal of Therapy:  Heparin level 0.3-0.7 units/ml Monitor platelets by anticoagulation protocol: Yes   Plan:  Increase heparin gtt at 1500 units/hr F/u 8 hour heparin level  Nicole Cella, RPh Clinical Pharmacist Please check AMION for all [place] numbers 04/01/2020 8:14 PM

## 2020-04-02 ENCOUNTER — Inpatient Hospital Stay (HOSPITAL_COMMUNITY): Payer: Medicare HMO

## 2020-04-02 LAB — BASIC METABOLIC PANEL
Anion gap: 12 (ref 5–15)
BUN: 21 mg/dL (ref 8–23)
CO2: 25 mmol/L (ref 22–32)
Calcium: 8.3 mg/dL — ABNORMAL LOW (ref 8.9–10.3)
Chloride: 111 mmol/L (ref 98–111)
Creatinine, Ser: 1.24 mg/dL (ref 0.61–1.24)
GFR calc Af Amer: >60 mL/min (ref >60)
GFR calc non Af Amer: >60 mL/min (ref >60)
Glucose, Bld: 114 mg/dL — ABNORMAL HIGH (ref 70–99)
Potassium: 3.5 mmol/L (ref 3.5–5.1)
Sodium: 148 mmol/L — ABNORMAL HIGH (ref 135–145)

## 2020-04-02 LAB — CBC
HCT: 35.2 % — ABNORMAL LOW (ref 39.0–52.0)
Hemoglobin: 10.9 g/dL — ABNORMAL LOW (ref 13.0–17.0)
MCH: 29.5 pg (ref 26.0–34.0)
MCHC: 31 g/dL (ref 30.0–36.0)
MCV: 95.4 fL (ref 80.0–100.0)
Platelets: 170 10*3/uL (ref 150–400)
RBC: 3.69 MIL/uL — ABNORMAL LOW (ref 4.22–5.81)
RDW: 14.6 % (ref 11.5–15.5)
WBC: 15.6 10*3/uL — ABNORMAL HIGH (ref 4.0–10.5)
nRBC: 0 % (ref 0.0–0.2)

## 2020-04-02 LAB — GLUCOSE, CAPILLARY
Glucose-Capillary: 105 mg/dL — ABNORMAL HIGH (ref 70–99)
Glucose-Capillary: 104 mg/dL — ABNORMAL HIGH (ref 70–99)
Glucose-Capillary: 107 mg/dL — ABNORMAL HIGH (ref 70–99)
Glucose-Capillary: 93 mg/dL (ref 70–99)
Glucose-Capillary: 87 mg/dL (ref 70–99)

## 2020-04-02 LAB — MAGNESIUM: Magnesium: 2.6 mg/dL — ABNORMAL HIGH (ref 1.7–2.4)

## 2020-04-02 LAB — HIV ANTIBODY (ROUTINE TESTING W REFLEX): HIV Screen 4th Generation wRfx: NONREACTIVE

## 2020-04-02 LAB — HEPARIN LEVEL (UNFRACTIONATED)
Heparin Unfractionated: 0.27 IU/mL — ABNORMAL LOW (ref 0.30–0.70)
Heparin Unfractionated: 0.28 IU/mL — ABNORMAL LOW (ref 0.30–0.70)

## 2020-04-02 MED ORDER — POTASSIUM CHLORIDE 10 MEQ/100ML IV SOLN
10.0000 meq | INTRAVENOUS | Status: AC
  Administered 2020-04-02 (×4): 10 meq via INTRAVENOUS
  Filled 2020-04-02 (×3): qty 100

## 2020-04-02 MED ORDER — POTASSIUM CHLORIDE 10 MEQ/100ML IV SOLN
10.0000 meq | INTRAVENOUS | Status: DC
  Filled 2020-04-02: qty 100

## 2020-04-02 MED ORDER — BISACODYL 10 MG RE SUPP
10.0000 mg | Freq: Once | RECTAL | Status: AC
  Administered 2020-04-02: 10 mg via RECTAL
  Filled 2020-04-02: qty 1

## 2020-04-02 NOTE — Progress Notes (Signed)
[facility] Progress Note  2 Days Post-Op  Subjective:  No flatus or BM yet. Mobilized once yesterday. NGT with bilious drainage. Wife present at bedside.   Objective: Vital signs in last 24 hours: Temp:  [97.8 F (36.6 C)-98.5 F (36.9 C)] 98 F (36.7 C) (05/15 0808) Pulse Rate:  [80-100] 80 (05/15 0808) Resp:  [13-20] 13 (05/15 0808) BP: (109-143)/(71-90) 135/82 (05/15 0808) SpO2:  [90 %-100 %] 92 % (05/15 0808) Last BM Date: 03/22/20  Intake/Output from previous day: 05/14 0701 - 05/15 0700 In: 0  Out: 325 [Urine:325] Intake/Output this shift: Total I/O In: 0  Out: 300 [Urine:300]  PE: Abd: soft, appropriately tender, absent BS, incisions c/d/i with dermabond present, NGT in place with bilious output    Lab Results:  Recent Labs    04/01/20 0440 04/02/20 0451  WBC 11.9* 15.6*  HGB 11.4* 10.9*  HCT 36.2* 35.2*  PLT 175 170   BMET Recent Labs    04/01/20 0657 04/02/20 0451  NA 147* 148*  K 3.8 3.5  CL 111 111  CO2 25 25  GLUCOSE 138* 114*  BUN 21 21  CREATININE 1.35* 1.24  CALCIUM 8.3* 8.3*   PT/INR No results for input(s): LABPROT, INR in the last 72 hours. CMP     Component Value Date/Time   NA 148 (H) 04/02/2020 0451   K 3.5 04/02/2020 0451   CL 111 04/02/2020 0451   CO2 25 04/02/2020 0451   GLUCOSE 114 (H) 04/02/2020 0451   BUN 21 04/02/2020 0451   CREATININE 1.24 04/02/2020 0451   CALCIUM 8.3 (L) 04/02/2020 0451   PROT 6.9 03/29/2020 0312   ALBUMIN 3.2 (L) 03/29/2020 0312   AST 40 03/29/2020 0312   ALT 55 (H) 03/29/2020 0312   ALKPHOS 66 03/29/2020 0312   BILITOT 1.2 03/29/2020 0312   GFRNONAA >60 04/02/2020 0451   GFRAA >60 04/02/2020 0451   Lipase     Component Value Date/Time   LIPASE 56 (H) 03/28/2020 0546       Studies/Results: No results found.  Anti-infectives: Anti-infectives (From admission, onward)   None       Assessment/Plan HTN AKI - Cr improving, UOP is good. 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 Kieth Brightly 5/13 -pain improved, cont multimodal pain control  -cont NGT and await bowel function -mobilize at least TID and up in chair -incentive spirometer -try suppository today   FEN -NPO/NGT VTE -coumadin on hold,heparin gtt ID -preop dose given, otherwise none  LOS: 5 days    Norm Parcel , [facility] 04/02/2020, 11:36 AM Please see Amion for pager number during day hours 7:00am-4:30pm

## 2020-04-02 NOTE — Progress Notes (Signed)
[place] for heparin Indication: hx VTE  Allergies  Allergen Reactions  . Iodine Hives    Patient Measurements: Height: 6\' 1"  (185.4 cm) Weight: 124.7 kg (274 lb 14.6 oz) IBW/kg (Calculated) : 79.9 Heparin Dosing Weight: 107.3kg  Vital Signs: Temp: 97.8 F (36.6 C) (05/15 0423) Temp Source: Oral (05/15 0423) BP: 143/90 (05/15 0423) Pulse Rate: 81 (05/15 0423)  Labs: Recent Labs    03/31/20 0304 03/31/20 0304 04/01/20 0440 04/01/20 0657 04/01/20 1859 04/02/20 0451  HGB 11.6*   < > 11.4*  --   --  10.9*  HCT 37.0*  --  36.2*  --   --  35.2*  PLT 242  --  175  --   --  170  HEPARINUNFRC  --   --  <0.10*  --  0.14* 0.27*  CREATININE 1.18  --   --  1.35*  --   --    < > = values in this interval not displayed.    Estimated Creatinine Clearance: 75.5 mL/min (A) (by C-G formula based on SCr of 1.35 mg/dL (H)).   Assessment: 70 yom presented to the ED with abdominal pain, found to have a SBO. He is on chronic warfarin for history for VTE.  Patient had surgery on 5/4 and Pharmacy asked to dose IV heparin.  S/p lysis of adhesions/enterotomy,  Heparin restarted.  Heparin level this am 0.27 units/ml  Hg 10.9, PTLC 170  No bleeding noted  Goal of Therapy:  Heparin level 0.3-0.7 units/ml Monitor platelets by anticoagulation protocol: Yes   Plan:  Increase heparin gtt at 1600 units/hr F/u 8 hour heparin level Thanks for allowing pharmacy to be a part of this patient's care.  Excell Seltzer, PharmD Clinical Pharmacist   04/02/2020 5:57 AM

## 2020-04-02 NOTE — Progress Notes (Signed)
Patient ID: Dwayne Lawrence, male   DOB: 21-Oct-1956, 64 y.o.   MRN: [id number] Came by to check on the patient from an Ortho standpoint.  His left hip is stable and we did place a new dressing.  He can be up from with PT when allowed with full WBAT left hip and no hip precautions.  I plan on leaving in his left hip staples for at least a week longer.

## 2020-04-02 NOTE — Progress Notes (Signed)
   Subjective: Dwayne Lawrence was seen this AM on bedside rounds. He reports that he is still feeling bad, but it is unchanged from previous. Denies ability to pass gas or any BMs. He denies nausea.   We discussed plans to work with surgery on deciding next steps from a surgical standpoint.   Objective:  Vital signs in last 24 hours: Vitals:   04/01/20 1546 04/01/20 2035 04/01/20 2334 04/02/20 0423  BP: 132/78 128/72 127/82 (!) 143/90  Pulse: 96 100 94 81  Resp: 18 17 14 18   Temp:  97.9 F (36.6 C) 98.2 F (36.8 C) 97.8 F (36.6 C)  TempSrc:  Oral Oral Oral  SpO2:  90% 96% 97%  Weight:      Height:       General: Middle-aged male, no acute distress, lying in bed, NG tube in place Cardiac: Regular rate and rhythm, no murmurs rubs or gallops Pulmonary: CTA BL, no wheezing,, rales Abdomen: Soft, distended, no appreciable bowel sounds, TTP diffusely  Assessment/Plan:  Active Problems:   Small bowel obstruction ([place])  64 yo male who underwent an uncomplicated LTHA on 5/4 and developed progressive abd distension, n/v who was admitted to IMTS on 5/10 for SBO.  SBO s/p larproscopic lysis of adhesions on 5/13: POD day 2.  Still having no appreciable bowel sounds, and no output.  Potassium 3.5 today, repleting, mag 2.6.  Repeating x-ray today.  Surgery following, appreciate recommendations.  -Surgery following, appreciate recommendations -Monitor electrolytes - KCL 50 mEq IV  -Continue NPO -X-ray today -Suppository per surgery -Dilaudid for pain control  Hx of PE: On warfarin at home. Currently on heparin drip  -continue heparin drip for now  Left THA on 5/4: Ortho saw today, up with PT with full WBAT, leave in hip staples for at least one more week.   Barriers to Discharge: clinical improvement Dispo: Anticipated discharge in approximately 2-3 day(s).   Dwayne Noble, MD 04/02/2020, 6:19 AM Pager: [phone]

## 2020-04-02 NOTE — Progress Notes (Signed)
[place] for heparin Indication: hx VTE  Allergies  Allergen Reactions  . Iodine Hives    Patient Measurements: Height: 6\' 1"  (185.4 cm) Weight: 124.7 kg (274 lb 14.6 oz) IBW/kg (Calculated) : 79.9 Heparin Dosing Weight: 107.3kg  Vital Signs: Temp: 98 F (36.7 C) (05/15 1141) Temp Source: Oral (05/15 1141) BP: 131/85 (05/15 1141) Pulse Rate: 97 (05/15 1141)  Labs: Recent Labs    03/31/20 0304 03/31/20 0304 04/01/20 0440 04/01/20 0440 04/01/20 0657 04/01/20 1859 04/02/20 0451 04/02/20 1424  HGB 11.6*   < > 11.4*  --   --   --  10.9*  --   HCT 37.0*  --  36.2*  --   --   --  35.2*  --   PLT 242  --  175  --   --   --  170  --   HEPARINUNFRC  --   --  <0.10*   < >  --  0.14* 0.27* 0.28*  CREATININE 1.18  --   --   --  1.35*  --  1.24  --    < > = values in this interval not displayed.    Estimated Creatinine Clearance: 82.2 mL/min (by C-G formula based on SCr of 1.24 mg/dL).   Assessment: 75 yom presented to the ED with abdominal pain, found to have a SBO. He is on chronic warfarin for history for VTE.  Patient had surgery on 5/4 and Pharmacy asked to dose IV heparin.  S/p lysis of adhesions/enterotomy,  Heparin restarted.  Heparin level remains subtherapeutic after increasing drip rate to 1600 units/hr. CBC stable, no overt bleeding or infusion issues per RN.  Goal of Therapy:  Heparin level 0.3-0.7 units/ml Monitor platelets by anticoagulation protocol: Yes   Plan:  Increase heparin gtt to 1750 units/hr Recheck heparin level with AM labs Monitor daily HL, CBC, s/sx bleeding F/u transition back to PO anticoagulation  Richardine Service, PharmD PGY1 Pharmacy Resident Phone: [phone] 04/02/2020  3:18 PM  Please check [email or web address] for unit-specific pharmacy phone numbers.

## 2020-04-03 LAB — BASIC METABOLIC PANEL
Anion gap: 8 (ref 5–15)
BUN: 14 mg/dL (ref 8–23)
CO2: 26 mmol/L (ref 22–32)
Calcium: 8.2 mg/dL — ABNORMAL LOW (ref 8.9–10.3)
Chloride: 115 mmol/L — ABNORMAL HIGH (ref 98–111)
Creatinine, Ser: 1.04 mg/dL (ref 0.61–1.24)
GFR calc Af Amer: >60 mL/min (ref >60)
GFR calc non Af Amer: >60 mL/min (ref >60)
Glucose, Bld: 102 mg/dL — ABNORMAL HIGH (ref 70–99)
Potassium: 3.5 mmol/L (ref 3.5–5.1)
Sodium: 149 mmol/L — ABNORMAL HIGH (ref 135–145)

## 2020-04-03 LAB — GLUCOSE, CAPILLARY
Glucose-Capillary: 105 mg/dL — ABNORMAL HIGH (ref 70–99)
Glucose-Capillary: 91 mg/dL (ref 70–99)
Glucose-Capillary: 92 mg/dL (ref 70–99)
Glucose-Capillary: 93 mg/dL (ref 70–99)
Glucose-Capillary: 94 mg/dL (ref 70–99)
Glucose-Capillary: 98 mg/dL (ref 70–99)

## 2020-04-03 LAB — CBC
HCT: 31.3 % — ABNORMAL LOW (ref 39.0–52.0)
Hemoglobin: 9.6 g/dL — ABNORMAL LOW (ref 13.0–17.0)
MCH: 29.3 pg (ref 26.0–34.0)
MCHC: 30.7 g/dL (ref 30.0–36.0)
MCV: 95.4 fL (ref 80.0–100.0)
Platelets: 161 10*3/uL (ref 150–400)
RBC: 3.28 MIL/uL — ABNORMAL LOW (ref 4.22–5.81)
RDW: 14.8 % (ref 11.5–15.5)
WBC: 13 10*3/uL — ABNORMAL HIGH (ref 4.0–10.5)
nRBC: 0 % (ref 0.0–0.2)

## 2020-04-03 LAB — HEPARIN LEVEL (UNFRACTIONATED)
Heparin Unfractionated: 0.28 IU/mL — ABNORMAL LOW (ref 0.30–0.70)
Heparin Unfractionated: 0.41 IU/mL (ref 0.30–0.70)

## 2020-04-03 MED ORDER — DEXTROSE 5 % IV SOLN: INTRAVENOUS | Status: DC

## 2020-04-03 NOTE — Progress Notes (Signed)
[place] for heparin Indication: hx VTE  Assessment: 2 yom presented to the ED with abdominal pain, found to have a SBO. He is on chronic warfarin for history for VTE.  Patient had surgery on 5/4 and Pharmacy asked to dose IV heparin.  S/p lysis of adhesions/enterotomy,  Heparin restarted.  Heparin level now 0.28 units/ml.    Goal of Therapy:  Heparin level 0.3-0.7 units/ml Monitor platelets by anticoagulation protocol: Yes   Plan:  Increase heparin gtt to 1850 units/hr Recheck heparin level in 6 hours Monitor daily HL, CBC, s/sx bleeding F/u transition back to PO anticoagulation possibly  Thanks for allowing pharmacy to be a part of this patient's care.  Excell Seltzer, PharmD Clinical Pharmacist

## 2020-04-03 NOTE — Progress Notes (Signed)
Occupational Therapy Treatment Patient Details Name: Dwayne Lawrence MRN: [id number] DOB: 1955-12-19 Today's Date: 04/03/2020    History of present illness 64 y.o male with a history of prostate cancer s/p resection, HTN, gout, prior DVT/PE on warfarin, and arthritis who recently underwent L total hip arthroplasty on 03/22/20 who presented to the ED with progressive N/V and abdominal pain since shortly after discharge on 03/23/20. . s/p laparoscopic lysis of adhesions, enterrohaphy x2 on 5/13.   OT comments  Patient progressing towards goals. Pt min A with bed mobility for trunk support, min guard with bed height elevated for sit to stand, min cues for hand placement. Patient tolerate functional ambulation with rolling walker at min guard down full length of hallway with x1 standing rest break. Patient transfer to recliner with min guard assist and verbal cues for safety with eccentric control to reach back for chair arms. Will continue to follow.   Follow Up Recommendations  Home health OT    Equipment Recommendations  Other (comment)(LH AE)       Precautions / Restrictions Precautions Precautions: Fall Restrictions Weight Bearing Restrictions: Yes LLE Weight Bearing: Weight bearing as tolerated       Mobility Bed Mobility Overal bed mobility: Needs Assistance Bed Mobility: Sit to Supine     Supine to sit: Min assist;HOB elevated     General bed mobility comments: for trunk elevation  Transfers Overall transfer level: Needs assistance Equipment used: Rolling walker (2 wheeled) Transfers: Sit to/from Stand Sit to Stand: Min guard;From elevated surface         General transfer comment: pt/spouse request bed height increased to match that of home bed, min guard with cues for hand placement to stand    Balance Overall balance assessment: Needs assistance Sitting-balance support: No upper extremity supported;Feet supported Sitting balance-Leahy Scale: Good     Standing  balance support: Bilateral upper extremity supported;During functional activity Standing balance-Leahy Scale: Poor Standing balance comment: reliant on B UE support                           ADL either performed or assessed with clinical judgement   ADL Overall ADL's : Needs assistance/impaired                     Lower Body Dressing: Maximal assistance Lower Body Dressing Details (indicate cue type and reason): pull up socks Toilet Transfer: Min guard;Ambulation;RW Toilet Transfer Details (indicate cue type and reason): simulated with transfer to recliner, min cues for safety with body mechanics due to decreased eccentric control with sitting         Functional mobility during ADLs: Min guard;Rolling walker                 Cognition Arousal/Alertness: Awake/alert Behavior During Therapy: WFL for tasks assessed/performed Overall Cognitive Status: Within Functional Limits for tasks assessed                                                General Comments poor wave form throughout treatment, patient continuously denying shortness of breath with readings in 70s on room air although likely inaccurate due to poor wave form    Pertinent Vitals/ Pain       Pain Assessment: Faces Faces Pain Scale: Hurts a little bit Pain Location: abdomen, L  hip Pain Descriptors / Indicators: Sore Pain Intervention(s): Monitored during session         Frequency  Min 2X/week        Progress Toward Goals  OT Goals(current goals can now be found in the care plan section)  Progress towards OT goals: Progressing toward goals  Acute Rehab OT Goals Patient Stated Goal: make good progress with mobility again OT Goal Formulation: With patient/family Time For Goal Achievement: 04/15/20 Potential to Achieve Goals: Good ADL Goals Pt Will Perform Grooming: with supervision;with set-up;standing;with caregiver independent in assisting Pt Will Perform Upper  Body Bathing: with set-up;sitting;with caregiver independent in assisting Pt Will Perform Lower Body Bathing: with mod assist;with adaptive equipment;with caregiver independent in assisting Pt Will Perform Upper Body Dressing: with set-up;with caregiver independent in assisting;sitting Pt Will Perform Lower Body Dressing: with mod assist;with adaptive equipment;with caregiver independent in assisting Pt Will Transfer to Toilet: with min guard assist;with supervision;ambulating;grab bars Pt Will Perform Toileting - Clothing Manipulation and hygiene: with min guard assist;sit to/from stand  Plan Discharge plan remains appropriate       AM-PAC OT "6 Clicks" Daily Activity     Outcome Measure   Help from another person eating meals?: Total(NPO) Help from another person taking care of personal grooming?: A Little Help from another person toileting, which includes using toliet, bedpan, or urinal?: A Little Help from another person bathing (including washing, rinsing, drying)?: A Lot Help from another person to put on and taking off regular upper body clothing?: A Little Help from another person to put on and taking off regular lower body clothing?: A Lot 6 Click Score: 14    End of Session Equipment Utilized During Treatment: Rolling walker  OT Visit Diagnosis: Unsteadiness on feet (R26.81);Other abnormalities of gait and mobility (R26.89);Pain Pain - Right/Left: Left Pain - part of body: Hip(abomen)   Activity Tolerance Patient tolerated treatment well   Patient Left in chair;with call bell/phone within reach;with nursing/sitter in room;with family/visitor present   Nurse Communication Mobility status        Time: 1351-1409 OT Time Calculation (min): 18 min  Charges: OT General Charges $OT Visit: 1 Visit OT Treatments $Self Care/Home Management : 8-22 mins  Delbert Phenix OT OT office: [place] 04/03/2020, 2:59 PM

## 2020-04-03 NOTE — Progress Notes (Signed)
[place] for heparin Indication: hx VTE  Allergies  Allergen Reactions  . Iodine Hives    Patient Measurements: Height: 6\' 1"  (185.4 cm) Weight: 124.7 kg (274 lb 14.6 oz) IBW/kg (Calculated) : 79.9 Heparin Dosing Weight: 107.3kg  Vital Signs: Temp: 98.1 F (36.7 C) (05/16 0816) Temp Source: Oral (05/16 0816) BP: 134/87 (05/16 0816) Pulse Rate: 73 (05/16 0816)  Labs: Recent Labs     0000 04/01/20 0440 04/01/20 0657 04/01/20 1859 04/02/20 0451 04/02/20 0451 04/02/20 1424 04/03/20 0246 04/03/20 0959  HGB   < > 11.4*  --   --  10.9*  --   --  9.6*  --   HCT  --  36.2*  --   --  35.2*  --   --  31.3*  --   PLT  --  175  --   --  170  --   --  161  --   HEPARINUNFRC  --  <0.10*  --    < > 0.27*   < > 0.28* 0.28* 0.41  CREATININE  --   --  1.35*  --  1.24  --   --  1.04  --    < > = values in this interval not displayed.    Estimated Creatinine Clearance: 98 mL/min (by C-G formula based on SCr of 1.04 mg/dL).   Assessment: 64 yom presented to the ED with abdominal pain, found to have a SBO. He is on chronic warfarin for history for VTE.  Patient had surgery on 5/4 and Pharmacy asked to dose IV heparin.  S/p lysis of adhesions/enterotomy,  Heparin restarted.  Heparin level now therapeutic after increasing drip rate to 1850 units/hr. Hgb trending down to 9.6 from 10.9, plts wnl.   RN states that he has been having some issues with the line and IV team has been called to place another IV. Pt also reports that the machine beeped a lot last night. But no overt bleeding reported.  Goal of Therapy:  Heparin level 0.3-0.7 units/ml Monitor platelets by anticoagulation protocol: Yes   Plan:  Continue heparin gtt at 1850 units/hr Monitor daily HL, CBC, s/sx bleeding F/u transition back to PO anticoagulation  Richardine Service, PharmD PGY1 Pharmacy Resident Phone: [phone] 04/03/2020  11:09 AM  Please check [email or web address] for  unit-specific pharmacy phone numbers.

## 2020-04-03 NOTE — Progress Notes (Signed)
Patient ID: Dwayne Lawrence, male   DOB: 04/06/1956, 64 y.o.   MRN: [id number]   Acute Care Surgery Service Progress Note:    Chief Complaint/Subjective: Reports a BM yesterday. Some flatus Pain ok  Objective: Vital signs in last 24 hours: Temp:  [98 F (36.7 C)-98.9 F (37.2 C)] 98.1 F (36.7 C) (05/16 0816) Pulse Rate:  [73-98] 73 (05/16 0816) Resp:  [15-23] 15 (05/16 0816) BP: (131-147)/(71-87) 134/87 (05/16 0816) SpO2:  [93 %-99 %] 99 % (05/16 0816) Last BM Date: 04/02/20  Intake/Output from previous day: 05/15 0701 - 05/16 0700 In: 2866.6 [I.V.:2390; IV Piggyback:476.6] Out: 1115 [Urine:875; Emesis/NG output:240] Intake/Output this shift: Total I/O In: 0  Out: 250 [Urine:250]  Lungs: cta, nonlabored  Cardiovascular: reg  Abd: distended, soft, +BS, min NG output- about 200cc so far today  Extremities: no edema, +SCDs  Neuro: alert, nonfocal  Lab Results: CBC  Recent Labs    04/02/20 0451 04/03/20 0246  WBC 15.6* 13.0*  HGB 10.9* 9.6*  HCT 35.2* 31.3*  PLT 170 161   BMET Recent Labs    04/02/20 0451 04/03/20 0246  NA 148* 149*  K 3.5 3.5  CL 111 115*  CO2 25 26  GLUCOSE 114* 102*  BUN 21 14  CREATININE 1.24 1.04  CALCIUM 8.3* 8.2*   LFT Hepatic Function Latest Ref Rng & Units 03/29/2020 03/28/2020  Total Protein 6.5 - 8.1 g/dL 6.9 7.7  Albumin 3.5 - 5.0 g/dL 3.2(L) 3.7  AST 15 - 41 U/L 40 44(H)  ALT 0 - 44 U/L 55(H) 60(H)  Alk Phosphatase 38 - 126 U/L 66 74  Total Bilirubin 0.3 - 1.2 mg/dL 1.2 1.0   PT/INR No results for input(s): LABPROT, INR in the last 72 hours. ABG No results for input(s): PHART, HCO3 in the last 72 hours.  Invalid input(s): PCO2, PO2  Studies/Results:  Anti-infectives: Anti-infectives (From admission, onward)   None      Medications: Scheduled Meds: . insulin aspart  0-9 Units Subcutaneous Q4H  . sodium chloride flush  3 mL Intravenous Once   Continuous Infusions: . dextrose 125 mL/hr at 04/03/20 1041    . heparin 1,850 Units/hr (04/03/20 0600)  . methocarbamol (ROBAXIN) IV 1,000 mg (04/03/20 0536)   PRN Meds:.HYDROmorphone (DILAUDID) injection, menthol-cetylpyridinium, phenol, promethazine  Assessment/Plan: Patient Active Problem List   Diagnosis Date Noted  . SBO (small bowel obstruction) ([place]) 03/28/2020  . Status post total replacement of left hip 03/22/2020  . Unilateral primary osteoarthritis, left hip 02/29/2020  . HCAP (healthcare-associated pneumonia) 10/15/2013  . S/P prostatectomy 10/15/2013  . SOB (shortness of breath) 10/15/2013  . HTN (hypertension) 10/15/2013   s/p Procedure(s): LAPAROSCOPIC LYSIS OF ADHESIONS LAPAROSCOPIC SMALL BOWEL REPAIR TIMES TWO 03/31/2020  HTN AKI - Cr improving, UOP is good. 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 Kieth Brightly 5/13 -pain improved, cont multimodal pain control  -clamp NGT and if tolerates clamp - will dc around 1pm today and let have sips of clears.  -mobilize at least TID and up in chair -incentive spirometer   FEN -NPO, ice /NGT - clamp this am VTE -coumadin on hold,heparin gtt ID -preop dose given, otherwise none Disposition: hopefully remove later today  LOS: 6 days    Haven Foss M. Redmond Pulling, MD, FACS General, Bariatric, & Minimally Invasive Surgery [phone] [facility]

## 2020-04-03 NOTE — Plan of Care (Signed)
Plan of care reviewed.   Problem: Clinical Measurements: Goal: Will remain free from infection Outcome: Progressing: remained afebrile.   Problem: Clinical Measurements: Goal: Respiratory complications will improve Outcome: Progressing: on 2 LPM of O2 NCL at night. SPO2 98%, no SOB.   Problem: Clinical Measurements: Goal: Cardiovascular complication will be avoided Outcome: Progressing: sinus rhythm on monitor, HR 70s-80s, BP within normal limits.   Problem: Nutrition: Goal: Adequate nutrition will be maintained Outcome: Progressing: continue to NPO. NG tube with low intermittent suction; emesis output green bile 120 ml, no nausea, no vomiting.    Problem: Pain Managment: Goal: General experience of comfort will improve Outcome: Progressing: pain tolerated well,denied abdominal pain, denied hip pain.  No incidents of immediate distress noted. Will continue to monitor.  Kennyth Lose, RN

## 2020-04-03 NOTE — Progress Notes (Signed)
NAME:  Dwayne Lawrence, MRN:  [id number], DOB:  02-27-1956, LOS: 6 ADMISSION DATE:  03/28/2020   Brief History  64 yo male who recently underwent an uncomplicated total left hip arthroplasty on 5/4 by Dr. Ninfa Linden and was subsequently discharged on 5/5. Pt developed progressive n/v and abdominal distension since 5/6 and presented to [facility] on 5/10 and was found to have a SBO. Last BM was on 5/3. Last flatus on 5/9. Pt reported having only took one day's worth of pain medication at home.  Subjective/Interm history  Patient notes having a bowel movement last evening.  He notes he is feeling significantly better today.  Significant Hospital Events   5/10>hospital admission 5/11>no significant changes in discomfort/nausea. NG suction turned up. Starting compazine and changing zofran to phenergan for nausea. 5/12>no significant changes on abd xray this morning. Symptoms improved. Having dark fecal appearing NG output 5/13> laparoscopy for SBO 5/14> restarting anticoagulation.  Still having fairly significant NG output 5/15> first bowel movement since admission 5/16> no significant event  Objective   Blood pressure (!) 142/71, pulse 77, temperature 98.1 F (36.7 C), temperature source Oral, resp. rate 18, height 6\' 1"  (1.854 m), weight 124.7 kg, SpO2 97 %.     Intake/Output Summary (Last 24 hours) at 04/03/2020 0643 Last data filed at 04/03/2020 0600 Gross per 24 hour  Intake 2866.62 ml  Output 1115 ml  Net 1751.62 ml   Filed Weights   03/28/20 0528 03/28/20 1900  Weight: 124.7 kg 124.7 kg    Examination: GENERAL: NAD CARDIAC: heart RRR. PULMONARY: Lungs clear ABDOMEN: minimal bs appreciated. Abd still distended but slightly softer than prior. No pain on palpation  Consults:  General surgery Ortho   Significant Diagnostic Tests:  5/10 CT a/p: SBO with transition zone in the mid jejuna level slightly to the right of the midline in the mid abdomen. No free air. Somewhat swirl like  appearance of the mid abd mesentary and vessels in mid abdomen near site of SBO--suspect internal hernia. No frank volvulus.  Micro Data:  n/a  Antimicrobials:  n/a  Summary  64 yo male who underwent an uncomplicated LTHA on 5/4 and developed progressive abd distension, n/v who was admitted to IMTS on 5/10 for medical management of SBO.  He unfortunately failed medical management and underwent diagnostic laparoscopy on 5/13. Resolved hospital problems  AKI Assessment & Plan:  Active Problems:   SBO (small bowel obstruction) (HCC)  SBO s/p LTHA. POD #3 laproscopic lysis of adhesions  Remains afebrile.  Leukocytosis improving Had his first BM last evening. Pt reports feeling quite a bit better today. Denies abd pain. Plan --gen surgery to determine when to clamp NG. Continue to monitor output --Continue IVF with LR  --Monitor electrolytes --Every 8 hours Robaxin IV  --NPO --Ambulate at least 3 times a day  Anemia.  Suspect components of anemia of chronic disease superimposed on postoperative.  Hemoglobin 9.6 this morning.  Baseline is usually in the mid tens.  Do not suspect any significant bleeding sources continue to monitor for now.  Hypernatremia.  Likely related to IVF. Will switch over to D5W @125cc /hr and d/c LR. Monitor CBGs intermittently while on D5W  History of PE   Plan: continue heparin drip  Chronic/Stable medical problems   Left Total Hip Arthroplasty (03/22/20). Ortho consulted on admission. WBAT. No hip precautions. HTN. Blood pressures stable.  Best practice:  CODE STATUS: FULL Diet: NPO DVT for prophylaxis: heparin gtt  Dispo: pending SBO medical management  Mitzi Hansen, MD INTERNAL MEDICINE RESIDENT PGY-1 PAGER #: [phone] 04/03/20  6:43 AM  Labs    CBC Latest Ref Rng & Units 04/03/2020 04/02/2020 04/01/2020  WBC 4.0 - 10.5 K/uL 13.0(H) 15.6(H) 11.9(H)  Hemoglobin 13.0 - 17.0 g/dL 9.6(L) 10.9(L) 11.4(L)  Hematocrit 39.0 - 52.0 % 31.3(L)  35.2(L) 36.2(L)  Platelets 150 - 400 K/uL 161 170 175   BMP Latest Ref Rng & Units 04/03/2020 04/02/2020 04/01/2020  Glucose 70 - 99 mg/dL 102(H) 114(H) 138(H)  BUN 8 - 23 mg/dL 14 21 21   Creatinine 0.61 - 1.24 mg/dL 1.04 1.24 1.35(H)  Sodium 135 - 145 mmol/L 149(H) 148(H) 147(H)  Potassium 3.5 - 5.1 mmol/L 3.5 3.5 3.8  Chloride 98 - 111 mmol/L 115(H) 111 111  CO2 22 - 32 mmol/L 26 25 25   Calcium 8.9 - 10.3 mg/dL 8.2(L) 8.3(L) 8.3(L)

## 2020-04-04 ENCOUNTER — Telehealth: Payer: Self-pay | Admitting: Orthopaedic Surgery

## 2020-04-04 LAB — BASIC METABOLIC PANEL
Anion gap: 12 (ref 5–15)
BUN: 12 mg/dL (ref 8–23)
CO2: 23 mmol/L (ref 22–32)
Calcium: 8.1 mg/dL — ABNORMAL LOW (ref 8.9–10.3)
Chloride: 109 mmol/L (ref 98–111)
Creatinine, Ser: 1.06 mg/dL (ref 0.61–1.24)
GFR calc Af Amer: >60 mL/min (ref >60)
GFR calc non Af Amer: >60 mL/min (ref >60)
Glucose, Bld: 125 mg/dL — ABNORMAL HIGH (ref 70–99)
Potassium: 3.2 mmol/L — ABNORMAL LOW (ref 3.5–5.1)
Sodium: 144 mmol/L (ref 135–145)

## 2020-04-04 LAB — CBC
HCT: 31.1 % — ABNORMAL LOW (ref 39.0–52.0)
Hemoglobin: 9.7 g/dL — ABNORMAL LOW (ref 13.0–17.0)
MCH: 29.2 pg (ref 26.0–34.0)
MCHC: 31.2 g/dL (ref 30.0–36.0)
MCV: 93.7 fL (ref 80.0–100.0)
Platelets: 164 10*3/uL (ref 150–400)
RBC: 3.32 MIL/uL — ABNORMAL LOW (ref 4.22–5.81)
RDW: 14.8 % (ref 11.5–15.5)
WBC: 9.7 10*3/uL (ref 4.0–10.5)
nRBC: 0 % (ref 0.0–0.2)

## 2020-04-04 LAB — GLUCOSE, CAPILLARY
Glucose-Capillary: 100 mg/dL — ABNORMAL HIGH (ref 70–99)
Glucose-Capillary: 114 mg/dL — ABNORMAL HIGH (ref 70–99)
Glucose-Capillary: 117 mg/dL — ABNORMAL HIGH (ref 70–99)
Glucose-Capillary: 138 mg/dL — ABNORMAL HIGH (ref 70–99)
Glucose-Capillary: 84 mg/dL (ref 70–99)
Glucose-Capillary: 95 mg/dL (ref 70–99)
Glucose-Capillary: 98 mg/dL (ref 70–99)

## 2020-04-04 LAB — MAGNESIUM: Magnesium: 2.3 mg/dL (ref 1.7–2.4)

## 2020-04-04 LAB — HEPARIN LEVEL (UNFRACTIONATED): Heparin Unfractionated: 0.37 IU/mL (ref 0.30–0.70)

## 2020-04-04 MED ORDER — POTASSIUM CHLORIDE 10 MEQ/100ML IV SOLN
10.0000 meq | INTRAVENOUS | Status: AC
  Administered 2020-04-04 (×5): 10 meq via INTRAVENOUS
  Filled 2020-04-04 (×5): qty 100

## 2020-04-04 MED ORDER — WARFARIN SODIUM 2.5 MG PO TABS
12.5000 mg | ORAL_TABLET | Freq: Once | ORAL | Status: AC
  Administered 2020-04-04: 12.5 mg via ORAL
  Filled 2020-04-04: qty 1

## 2020-04-04 MED ORDER — WARFARIN - PHARMACIST DOSING INPATIENT: Freq: Every day | Status: DC

## 2020-04-04 NOTE — Progress Notes (Addendum)
NAME:  Dwayne Lawrence, MRN:  [id number], DOB:  31-Aug-1956, LOS: 7 ADMISSION DATE:  03/28/2020   Brief History  64 yo male who recently underwent an uncomplicated total left hip arthroplasty on 5/4 by Dr. Ninfa Linden and was subsequently discharged on 5/5. Pt developed progressive n/v and abdominal distension since 5/6 and presented to [facility] on 5/10 and was found to have a SBO. Last BM was on 5/3. Last flatus on 5/9. Pt reported having only took one day's worth of pain medication at home.  Subjective/Interm history  NG out today. Pt notes that liquids are going well this morning. Had another BM as well  Significant Hospital Events   5/10>hospital admission 5/11>no significant changes in discomfort/nausea. NG suction turned up. Starting compazine and changing zofran to phenergan for nausea. 5/12>no significant changes on abd xray this morning. Symptoms improved. Having dark fecal appearing NG output 5/13> laparoscopy for SBO 5/14> restarting anticoagulation.  Still having fairly significant NG output 5/15> first bowel movement since admission 5/16> NG clamped  Objective   Blood pressure 127/83, pulse 81, temperature 98.1 F (36.7 C), temperature source Oral, resp. rate 17, height 6\' 1"  (1.854 m), weight 124.7 kg, SpO2 93 %.     Intake/Output Summary (Last 24 hours) at 04/04/2020 0534 Last data filed at 04/04/2020 0349 Gross per 24 hour  Intake 1130.21 ml  Output 600 ml  Net 530.21 ml    Examination: GENERAL: NAD CARDIAC: heart RRR. PULMONARY: Lungs clear ABDOMEN: minimal bs appreciated. Abd still distended but slightly softer than prior. No pain on palpation  Consults:  General surgery Ortho   Significant Diagnostic Tests:  5/10 CT a/p: SBO with transition zone in the mid jejuna level slightly to the right of the midline in the mid abdomen. No free air. Somewhat swirl like appearance of the mid abd mesentary and vessels in mid abdomen near site of SBO--suspect internal hernia. No  frank volvulus.  Micro Data:  n/a  Antimicrobials:  n/a  Summary  64 yo male who underwent an uncomplicated LTHA on 5/4 and developed progressive abd distension, n/v who was admitted to IMTS on 5/10 for medical management of SBO.  He unfortunately failed medical management and underwent diagnostic laparoscopy on 5/13. Resolved hospital problems  AKI Assessment & Plan:  Active Problems:   SBO (small bowel obstruction) (HCC)  SBO s/p LTHA. POD #4 laproscopic lysis of adhesions  NG removed last evening. Plan: management per gen surg. Progressing full liquid>soft diet.  Hypernatremia.  Resolved Hypokalemia. 3.2 this morning. Replete as needed.  History of PE   Plan: d/c heparin. Resume warfarin.  Chronic/Stable medical problems   Left Total Hip Arthroplasty (03/22/20). Ortho consulted on admission. WBAT. No hip precautions. HTN. Blood pressures stable.  Best practice:  CODE STATUS: FULL Diet: full liquid/soft DVT for prophylaxis: heparin gtt  Dispo: pending SBO medical management  Mitzi Hansen, MD [place] PGY-1 PAGER #: [phone] 04/04/20  5:34 AM  Labs    CBC Latest Ref Rng & Units 04/04/2020 04/03/2020 04/02/2020  WBC 4.0 - 10.5 K/uL 9.7 13.0(H) 15.6(H)  Hemoglobin 13.0 - 17.0 g/dL 9.7(L) 9.6(L) 10.9(L)  Hematocrit 39.0 - 52.0 % 31.1(L) 31.3(L) 35.2(L)  Platelets 150 - 400 K/uL 164 161 170   BMP Latest Ref Rng & Units 04/03/2020 04/02/2020 04/01/2020  Glucose 70 - 99 mg/dL 102(H) 114(H) 138(H)  BUN 8 - 23 mg/dL 14 21 21   Creatinine 0.61 - 1.24 mg/dL 1.04 1.24 1.35(H)  Sodium 135 - 145 mmol/L 149(H)  148(H) 147(H)  Potassium 3.5 - 5.1 mmol/L 3.5 3.5 3.8  Chloride 98 - 111 mmol/L 115(H) 111 111  CO2 22 - 32 mmol/L 26 25 25   Calcium 8.9 - 10.3 mg/dL 8.2(L) 8.3(L) 8.3(L)

## 2020-04-04 NOTE — Telephone Encounter (Signed)
See below

## 2020-04-04 NOTE — Telephone Encounter (Signed)
Patient wife Anne Ng called requesting Dr. Ninfa Linden knows patient is hospitalized. 5/18/221 staples need to be removed from 2 week surgery. She wanted to make sure the hospital has clearance from Dr. Ninfa Linden for hospital to remove stitches. Patient wife Anne Ng phone number is [phone].

## 2020-04-04 NOTE — Progress Notes (Addendum)
[place] for heparin / warfarin Indication: hx VTE  Allergies  Allergen Reactions  . Iodine Hives    Patient Measurements: Height: 6\' 1"  (185.4 cm) Weight: 124.7 kg (274 lb 14.6 oz) IBW/kg (Calculated) : 79.9 Heparin Dosing Weight: 107.3kg  Vital Signs: Temp: 97.5 F (36.4 C) (05/17 0747) Temp Source: Oral (05/17 0747) BP: 130/78 (05/17 0747) Pulse Rate: 78 (05/17 0747)  Labs: Recent Labs    04/02/20 0451 04/02/20 1424 04/03/20 0246 04/03/20 0959 04/04/20 0247 04/04/20 0702  HGB 10.9*  --  9.6*  --  9.7*  --   HCT 35.2*  --  31.3*  --  31.1*  --   PLT 170  --  161  --  164  --   HEPARINUNFRC 0.27*   < > 0.28* 0.41 0.37  --   CREATININE 1.24  --  1.04  --   --  1.06   < > = values in this interval not displayed.    Estimated Creatinine Clearance: 96.1 mL/min (by C-G formula based on SCr of 1.06 mg/dL).   Assessment: 50 yom presented to the ED with abdominal pain, found to have a SBO. He is on chronic warfarin for history for VTE.  Patient had surgery on 5/4 and Pharmacy asked to dose IV heparin.  S/p lysis of adhesions/enterotomy,  Heparin restarted.  Heparin level now therapeutic, resuming warfarin today (dose prior to admission -> 10 mg daily)  Goal of Therapy:  Heparin level 0.3-0.7 units/ml Monitor platelets by anticoagulation protocol: Yes  INR = 2 to 3   Plan:  Continue heparin gtt at 1850 units/hr Monitor daily HL, CBC, s/sx bleeding Warfarin to resume today - > 12.5 mg po x 1  Thank you Anette Guarneri, PharmD 04/04/2020  8:48 AM  Please check [email or web address] for unit-specific pharmacy phone numbers.

## 2020-04-04 NOTE — Plan of Care (Signed)
Continue to monitor

## 2020-04-04 NOTE — Progress Notes (Signed)
Mobility Specialist: Progress Note    04/04/20 1627  Mobility  Activity Ambulated in hall  Level of Assistance Contact guard assist, steadying assist  Assistive Device Front wheel walker  Distance Ambulated (ft) 450 ft  Mobility Response Tolerated well  Mobility performed by Mobility specialist  Bed Position Chair  $Mobility charge 1 Mobility   Pre-Mobility: 88 HR, 91% SpO2 Post-Mobility: 90 HR, 95% SpO2  Pt tolerated ambulation well. Pt had to stretch left leg halfway through ambulation. Pt was positioned in chair with wife in the room.   [facility] Day Mobility Specialist

## 2020-04-04 NOTE — Progress Notes (Signed)
Physical Therapy Treatment Patient Details Name: Dwayne Lawrence MRN: [id number] DOB: 1956/07/11 Today's Date: 04/04/2020    History of Present Illness 64 y.o male with a history of prostate cancer s/p resection, HTN, gout, prior DVT/PE on warfarin, and arthritis who recently underwent L total hip arthroplasty on 03/22/20 who presented to the ED with progressive N/V and abdominal pain since shortly after discharge on 03/23/20. . s/p laparoscopic lysis of adhesions, enterrohaphy x2 on 5/13.    PT Comments    Patient progressing well towards PT goals. Pt feels better with NG tube out and reports having a BM this AM. Improved ambulation distance with Min guard assist for balance/safety. Noted to have 2-3/4 DOE with mobility but difficult to get 02 reading. Session cut short as pt needing to rush back to use bathroom. Encouraged pt to get mobilizing while in the hospital. Will follow.    Follow Up Recommendations  Supervision for mobility/OOB;Home health PT     Equipment Recommendations  None recommended by PT    Recommendations for Other Services       Precautions / Restrictions Precautions Precautions: Fall Restrictions Weight Bearing Restrictions: Yes LLE Weight Bearing: Weight bearing as tolerated    Mobility  Bed Mobility Overal bed mobility: Needs Assistance Bed Mobility: Supine to Sit     Supine to sit: HOB elevated;Supervision     General bed mobility comments: No assist needed; use of rail to get to EOB.  Transfers Overall transfer level: Needs assistance Equipment used: Rolling walker (2 wheeled) Transfers: Sit to/from Stand Sit to Stand: Min guard;From elevated surface         General transfer comment: Pt requested elevated bed height to simulate home bed height; good demo of hand placement. Transferred to [facility] post ambulation.  Ambulation/Gait Ambulation/Gait assistance: Min guard Gait Distance (Feet): 250 Feet Assistive device: Rolling walker (2 wheeled) Gait  Pattern/deviations: Step-through pattern;Antalgic;Decreased stride length   Gait velocity interpretation: 1.31 - 2.62 ft/sec, indicative of limited community ambulator General Gait Details: Antalgic like gait with flexed trunk; cues for upright posture and knee flexion during swing phase. 2-3/4 DOE. Difficult getting 02 reading. Needed to use bathroom so session cut short.   Stairs             Wheelchair Mobility    Modified Rankin (Stroke Patients Only)       Balance Overall balance assessment: Needs assistance Sitting-balance support: No upper extremity supported;Feet supported Sitting balance-Leahy Scale: Good     Standing balance support: During functional activity Standing balance-Leahy Scale: Poor Standing balance comment: reliant on B UE support                            Cognition Arousal/Alertness: Awake/alert Behavior During Therapy: WFL for tasks assessed/performed Overall Cognitive Status: Within Functional Limits for tasks assessed                                        Exercises      General Comments General comments (skin integrity, edema, etc.): Difficult getting 02 reading due to poor wave form      Pertinent Vitals/Pain Pain Assessment: No/denies pain    Home Living                      Prior Function  PT Goals (current goals can now be found in the care plan section) Progress towards PT goals: Progressing toward goals    Frequency    Min 3X/week      PT Plan Current plan remains appropriate    Co-evaluation              AM-PAC PT "6 Clicks" Mobility   Outcome Measure  Help needed turning from your back to your side while in a flat bed without using bedrails?: A Little Help needed moving from lying on your back to sitting on the side of a flat bed without using bedrails?: A Little Help needed moving to and from a bed to a chair (including a wheelchair)?: A Little Help  needed standing up from a chair using your arms (e.g., wheelchair or bedside chair)?: A Little Help needed to walk in hospital room?: A Little Help needed climbing 3-5 steps with a railing? : A Little 6 Click Score: 18    End of Session Equipment Utilized During Treatment: Gait belt Activity Tolerance: Patient tolerated treatment well Patient left: Other (comment)(left sitting on BSC with call bell; nurse tech aware) Nurse Communication: Mobility status PT Visit Diagnosis: Other abnormalities of gait and mobility (R26.89);Muscle weakness (generalized) (M62.81)     Time: 1001-1017 PT Time Calculation (min) (ACUTE ONLY): 16 min  Charges:  $Gait Training: 8-22 mins                     Marisa Severin, PT, DPT Acute Rehabilitation Services Pager [phone] Office [place] 04/04/2020, 12:06 PM

## 2020-04-04 NOTE — Care Management (Signed)
Per Sharlene Motts [facility] [phone]  Co-pay amount for Xarelto 10 mg. Daily for a 30 day supply $47.00. Co-pay amount for Eliquis 2.5 mg bid for a 30 day supply $47.00.  No PA required Deductible not met. Tier 3 Pharmacy : [facility]

## 2020-04-04 NOTE — Progress Notes (Signed)
Patient ID: Dwayne Lawrence, male   DOB: Jun 01, 1956, 64 y.o.   MRN: [id number]   Acute Care Surgery Service Progress Note:    Chief Complaint/Subjective: Reports a BM two days ago, and this morning - formed, brown, non-bloody. Having flatus. Tolerating sips of clears yesterday and this morning. States pain is controlled and does not want any more pain meds. Mobilizing twice daily.  Objective: Vital signs in last 24 hours: Temp:  [97.5 F (36.4 C)-98.5 F (36.9 C)] 97.5 F (36.4 C) (05/17 0747) Pulse Rate:  [76-86] 78 (05/17 0747) Resp:  [16-21] 16 (05/17 0747) BP: (127-149)/(78-85) 130/78 (05/17 0747) SpO2:  [92 %-96 %] 96 % (05/17 0747) Last BM Date: 04/02/20  Intake/Output from previous day: 05/16 0701 - 05/17 0700 In: 919.8 [I.V.:839.8; IV Piggyback:80] Out: 600 [Urine:500; Emesis/NG output:100] Intake/Output this shift: No intake/output data recorded.  Lungs: cta, nonlabored  Cardiovascular: RRR  Abd: distended, soft, +BS, incisions c/d/i with skin glue in place - no surrounding erythema   Extremities: no edema, +SCDs  Neuro: alert, nonfocal  Lab Results: CBC  Recent Labs    04/03/20 0246 04/04/20 0247  WBC 13.0* 9.7  HGB 9.6* 9.7*  HCT 31.3* 31.1*  PLT 161 164   BMET Recent Labs    04/03/20 0246 04/04/20 0702  NA 149* 144  K 3.5 3.2*  CL 115* 109  CO2 26 23  GLUCOSE 102* 125*  BUN 14 12  CREATININE 1.04 1.06  CALCIUM 8.2* 8.1*   LFT Hepatic Function Latest Ref Rng & Units 03/29/2020 03/28/2020  Total Protein 6.5 - 8.1 g/dL 6.9 7.7  Albumin 3.5 - 5.0 g/dL 3.2(L) 3.7  AST 15 - 41 U/L 40 44(H)  ALT 0 - 44 U/L 55(H) 60(H)  Alk Phosphatase 38 - 126 U/L 66 74  Total Bilirubin 0.3 - 1.2 mg/dL 1.2 1.0   PT/INR No results for input(s): LABPROT, INR in the last 72 hours. ABG No results for input(s): PHART, HCO3 in the last 72 hours.  Invalid input(s): PCO2, PO2  Studies/Results:  Anti-infectives: Anti-infectives (From admission, onward)   None      Medications: Scheduled Meds: . insulin aspart  0-9 Units Subcutaneous Q4H   Continuous Infusions: . dextrose 125 mL/hr at 04/04/20 0650  . heparin 1,850 Units/hr (04/04/20 0643)  . methocarbamol (ROBAXIN) IV 1,000 mg (04/03/20 1500)  . potassium chloride 10 mEq (04/04/20 0900)   PRN Meds:.HYDROmorphone (DILAUDID) injection, menthol-cetylpyridinium, phenol, promethazine  Assessment/Plan: Patient Active Problem List   Diagnosis Date Noted  . SBO (small bowel obstruction) ([place]) 03/28/2020  . Status post total replacement of left hip 03/22/2020  . Unilateral primary osteoarthritis, left hip 02/29/2020  . HCAP (healthcare-associated pneumonia) 10/15/2013  . S/P prostatectomy 10/15/2013  . SOB (shortness of breath) 10/15/2013  . HTN (hypertension) 10/15/2013   s/p Procedure(s): LAPAROSCOPIC LYSIS OF ADHESIONS LAPAROSCOPIC SMALL BOWEL REPAIR TIMES TWO 03/31/2020  HTN AKI - Cr improving, UOP is good. 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 Kieth Brightly 5/13 -pain improved, cont multimodal pain control PRN  - start FLD, advance to SOFT diet at dinner if tolerates -mobilize at least TID and up in chair -incentive spirometer   FEN -FLD, SOFT at dinner  VTE -coumadin on hold,heparin gtt; ok to resume PO anticoagulation from surgical perspective  ID -preop dose given, otherwise none Disposition: advance diet  as tolerated, anticoagulation per primary team   LOS: 7 days   Obie Dredge, PA-C [phone] [facility]

## 2020-04-05 ENCOUNTER — Inpatient Hospital Stay: Payer: Medicare HMO | Admitting: Orthopaedic Surgery

## 2020-04-05 DIAGNOSIS — Z8719 Personal history of other diseases of the digestive system: Secondary | ICD-10-CM

## 2020-04-05 LAB — CBC
HCT: 31.3 % — ABNORMAL LOW (ref 39.0–52.0)
Hemoglobin: 9.7 g/dL — ABNORMAL LOW (ref 13.0–17.0)
MCH: 28.7 pg (ref 26.0–34.0)
MCHC: 31 g/dL (ref 30.0–36.0)
MCV: 92.6 fL (ref 80.0–100.0)
Platelets: 170 10*3/uL (ref 150–400)
RBC: 3.38 MIL/uL — ABNORMAL LOW (ref 4.22–5.81)
RDW: 14.7 % (ref 11.5–15.5)
WBC: 9.4 10*3/uL (ref 4.0–10.5)
nRBC: 0 % (ref 0.0–0.2)

## 2020-04-05 LAB — BASIC METABOLIC PANEL
Anion gap: 11 (ref 5–15)
BUN: 11 mg/dL (ref 8–23)
CO2: 22 mmol/L (ref 22–32)
Calcium: 8.1 mg/dL — ABNORMAL LOW (ref 8.9–10.3)
Chloride: 107 mmol/L (ref 98–111)
Creatinine, Ser: 1.02 mg/dL (ref 0.61–1.24)
GFR calc Af Amer: >60 mL/min (ref >60)
GFR calc non Af Amer: >60 mL/min (ref >60)
Glucose, Bld: 128 mg/dL — ABNORMAL HIGH (ref 70–99)
Potassium: 3.1 mmol/L — ABNORMAL LOW (ref 3.5–5.1)
Sodium: 140 mmol/L (ref 135–145)

## 2020-04-05 LAB — HEPARIN LEVEL (UNFRACTIONATED): Heparin Unfractionated: 0.25 IU/mL — ABNORMAL LOW (ref 0.30–0.70)

## 2020-04-05 LAB — MAGNESIUM: Magnesium: 2 mg/dL (ref 1.7–2.4)

## 2020-04-05 LAB — GLUCOSE, CAPILLARY
Glucose-Capillary: 129 mg/dL — ABNORMAL HIGH (ref 70–99)
Glucose-Capillary: 117 mg/dL — ABNORMAL HIGH (ref 70–99)
Glucose-Capillary: 114 mg/dL — ABNORMAL HIGH (ref 70–99)

## 2020-04-05 LAB — PHOSPHORUS: Phosphorus: 3.4 mg/dL (ref 2.5–4.6)

## 2020-04-05 LAB — PROTIME-INR
INR: 1.2 (ref 0.8–1.2)
Prothrombin Time: 14.8 seconds (ref 11.4–15.2)

## 2020-04-05 MED ORDER — FAMOTIDINE 20 MG PO TABS
20.0000 mg | ORAL_TABLET | Freq: Two times a day (BID) | ORAL | Status: DC
  Administered 2020-04-05: 20 mg via ORAL
  Filled 2020-04-05: qty 1

## 2020-04-05 MED ORDER — POTASSIUM CHLORIDE 20 MEQ PO PACK
40.0000 meq | PACK | ORAL | Status: DC
  Administered 2020-04-05: 40 meq via ORAL
  Filled 2020-04-05: qty 2

## 2020-04-05 MED ORDER — ENOXAPARIN SODIUM 120 MG/0.8ML ~~LOC~~ SOLN
120.0000 mg | Freq: Two times a day (BID) | SUBCUTANEOUS | Status: DC
  Administered 2020-04-05: 120 mg via SUBCUTANEOUS
  Filled 2020-04-05 (×2): qty 0.8

## 2020-04-05 MED ORDER — POTASSIUM CHLORIDE CRYS ER 20 MEQ PO TBCR
40.0000 meq | EXTENDED_RELEASE_TABLET | Freq: Two times a day (BID) | ORAL | Status: DC
  Administered 2020-04-05: 40 meq via ORAL
  Filled 2020-04-05: qty 2

## 2020-04-05 MED ORDER — ENOXAPARIN SODIUM 120 MG/0.8ML ~~LOC~~ SOLN
120.0000 mg | SUBCUTANEOUS | 0 refills | Status: DC
Start: 2020-04-05 — End: 2020-04-05

## 2020-04-05 MED ORDER — ENOXAPARIN SODIUM 120 MG/0.8ML ~~LOC~~ SOLN
120.0000 mg | Freq: Two times a day (BID) | SUBCUTANEOUS | 0 refills | Status: DC
Start: 2020-04-05 — End: 2022-01-01

## 2020-04-05 MED ORDER — WARFARIN SODIUM 2.5 MG PO TABS
12.5000 mg | ORAL_TABLET | Freq: Once | ORAL | Status: AC
  Administered 2020-04-05: 12.5 mg via ORAL
  Filled 2020-04-05: qty 1

## 2020-04-05 MED ORDER — ENOXAPARIN SODIUM 120 MG/0.8ML ~~LOC~~ SOLN
120.0000 mg | Freq: Two times a day (BID) | SUBCUTANEOUS | 0 refills | Status: DC
Start: 2020-04-05 — End: 2020-04-05

## 2020-04-05 MED ORDER — ENOXAPARIN SODIUM 120 MG/0.8ML ~~LOC~~ SOLN: 120.0000 mg | Freq: Two times a day (BID) | SUBCUTANEOUS | 0 refills | Status: DC

## 2020-04-05 MED ORDER — METHOCARBAMOL 500 MG PO TABS
1000.0000 mg | ORAL_TABLET | Freq: Three times a day (TID) | ORAL | Status: DC
  Filled 2020-04-05: qty 2

## 2020-04-05 MED FILL — ENOXAPARIN SODIUM 120 MG/0.: 120 | 5 days supply | Qty: 8 | Fill #0

## 2020-04-05 NOTE — Care Management Important Message (Signed)
Important Message  Patient Details  Name: Dwayne Lawrence MRN: [id number] Date of Birth: 04/01/1956   Medicare Important Message Given:  Yes     Shelda Altes 04/05/2020, 8:33 AM

## 2020-04-05 NOTE — Progress Notes (Signed)
[place] for heparin Indication: hx VTE    Assessment: 38 yom presented to the ED with abdominal pain, found to have a SBO. He is on chronic warfarin for history for VTE.  Patient had surgery on 5/4 and Pharmacy asked to dose IV heparin.  S/p lysis of adhesions/enterotomy,  Heparin level 0.25 units/ml  Goal of Therapy:  Heparin level 0.3-0.7 units/ml Monitor platelets by anticoagulation protocol: Yes   Plan:  Increase heparin gtt to 1950 units/hr Monitor daily HL, CBC, s/sx bleeding  Thank you Excell Seltzer PharmD 04/05/2020  4:14 AM  Please check [email or web address] for unit-specific pharmacy phone numbers.

## 2020-04-05 NOTE — Final Consult Note (Signed)
Consultant Final Sign-Off Note    Assessment/Final recommendations  Dwayne Lawrence is a 64 y.o. male followed by me for SBO requiring laparoscopic lysis of adhesions and enterotomy repair x2. He is having bowel function and tolerating PO. Was placed back on coumadin yesterday 5/17.   Wound care (if applicable): shower with mild soap and water, no submerging incisions in bath/pool/hot tub/etc.   Diet at discharge: Regular   Activity at discharge: no lifting over 10-15lbs for 6 weeks gradually advance activity daily- walking and going up and down stairs is ok. Patient may drive if not taking narcotic medication.    Follow-up appointment:  Dr. Gurney Maxin in 3 weeks. Call to confirm appointment date/time.    Pending results:  Unresulted Labs (From admission, onward)    Start     Ordered   04/05/20 0659  Phosphorus  Add-on,   AD    Question:  Specimen collection method  Answer:  Lab=Lab collect   04/05/20 0658   04/05/20 0658  Magnesium  Add-on,   AD    Question:  Specimen collection method  Answer:  Lab=Lab collect   04/05/20 0657   04/05/20 0500  Protime-INR  Daily,   R    Question:  Specimen collection method  Answer:  Lab=Lab collect   04/04/20 1309   04/03/20 0500  Heparin level (unfractionated)  Daily,   R     04/01/20 2056   03/30/20 0500  CBC  Daily,   R     03/29/20 1816           Medication recommendations:   Other recommendations:    Thank you for allowing [place] to participate in the care of your patient!  Please consult [place] again if you have further needs for your patient.  Darci Current Disney Ruggiero 04/05/2020 8:12 AM    Subjective   Pain controlled. Tolerating PO. +BM, some flatus but not much. Denies belching. Reports some intermittent heartburn sxs, worse with laying down, present before admission. Ambulating with walker. No reported, CP, SOB, urinary sxs.  Objective  Vital signs in last 24 hours: Temp:  [98.1 F (36.7 C)-99.1 F (37.3 C)] 98.4 F (36.9 C)  (05/18 0745) Pulse Rate:  [76-88] 79 (05/18 0745) Resp:  [17-22] 17 (05/18 0745) BP: (131-149)/(79-93) 133/80 (05/18 0745) SpO2:  [94 %-96 %] 94 % (05/18 0745)  General: alert, NAD Lungs: cta, nonlabored Cardiovascular: RRR Abd: distended, soft, +BS, incisions c/d/i with skin glue in place - no surrounding erythema  Extremities: no edema, +SCDs Neuro: alert, nonfocal  Pertinent labs and Studies: Recent Labs    04/03/20 0246 04/04/20 0247 04/05/20 0319  WBC 13.0* 9.7 9.4  HGB 9.6* 9.7* 9.7*  HCT 31.3* 31.1* 31.3*   BMET Recent Labs    04/04/20 0702 04/05/20 0331  NA 144 140  K 3.2* 3.1*  CL 109 107  CO2 23 22  GLUCOSE 125* 128*  BUN 12 11  CREATININE 1.06 1.02  CALCIUM 8.1* 8.1*   No results for input(s): LABURIN in the last 72 hours. Results for orders placed or performed during the hospital encounter of 03/28/20  SARS Coronavirus 2 by RT PCR (hospital order, performed in [facility] hospital lab) Nasopharyngeal Nasopharyngeal Swab     Status: None   Collection Time: 03/28/20 10:52 AM   Specimen: Nasopharyngeal Swab  Result Value Ref Range Status   SARS Coronavirus 2 NEGATIVE NEGATIVE Final    Comment: (NOTE) SARS-CoV-2 target nucleic acids are NOT DETECTED. The SARS-CoV-2 RNA is  generally detectable in upper and lower respiratory specimens during the acute phase of infection. The lowest concentration of SARS-CoV-2 viral copies this assay can detect is 250 copies / mL. A negative result does not preclude SARS-CoV-2 infection and should not be used as the sole basis for treatment or other patient management decisions.  A negative result may occur with improper specimen collection / handling, submission of specimen other than nasopharyngeal swab, presence of viral mutation(s) within the areas targeted by this assay, and inadequate number of viral copies (<250 copies / mL). A negative result must be combined with clinical observations, patient history, and  epidemiological information. Fact Sheet for Patients:   StrictlyIdeas.no Fact Sheet for Healthcare Providers: BankingDealers.co.za This test is not yet approved or cleared  by the [place] FDA and has been authorized for detection and/or diagnosis of SARS-CoV-2 by FDA under an Emergency Use Authorization (EUA).  This EUA will remain in effect (meaning this test can be used) for the duration of the COVID-19 declaration under Section 564(b)(1) of the Act, 21 U.S.C. section 360bbb-3(b)(1), unless the authorization is terminated or revoked sooner. Performed at [facility], [place] [address], [place], [place] [address]     Imaging: No results found.

## 2020-04-05 NOTE — Progress Notes (Signed)
NAME:  Dwayne Lawrence, MRN:  [id number], DOB:  [birth year], LOS: 8 ADMISSION DATE:  03/28/2020   Brief History  64 yo male who recently underwent an uncomplicated total left hip arthroplasty on 5/4 by Dr. Ninfa Linden and was subsequently discharged on 5/5. Pt developed progressive n/v and abdominal distension since 5/6 and presented to [facility] on 5/10 and was found to have a SBO. Last BM was on 5/3. Last flatus on 5/9. Pt reported having only took one day's worth of pain medication at home.  Subjective/Interm history  No overnight events  Significant Hospital Events   5/10>hospital admission 5/11>no significant changes in discomfort/nausea. NG suction turned up. Starting compazine and changing zofran to phenergan for nausea. 5/12>no significant changes on abd xray this morning. Symptoms improved. Having dark fecal appearing NG output 5/13> laparoscopy for SBO 5/14> restarting anticoagulation.  Still having fairly significant NG output 5/15> first bowel movement since admission 5/16> NG clamped and removed.  5/17>advancing diet. BM x1 5/18>discharge  Objective   Blood pressure 133/80, pulse 79, temperature 98.4 F (36.9 C), temperature source Oral, resp. rate 17, height 6\' 1"  (1.854 m), weight 124.7 kg, SpO2 94 %.     Intake/Output Summary (Last 24 hours) at 04/05/2020 1029 Last data filed at 04/05/2020 0952 Gross per 24 hour  Intake 3260.42 ml  Output 250 ml  Net 3010.42 ml    Examination: GENERAL: NAD CARDIAC: heart RRR. PULMONARY: Lungs clear ABDOMEN: abd remains distended. +bs. nontender.  Consults:  General surgery Ortho   Significant Diagnostic Tests:  5/10 CT a/p: SBO with transition zone in the mid jejuna level slightly to the right of the midline in the mid abdomen. No free air. Somewhat swirl like appearance of the mid abd mesentary and vessels in mid abdomen near site of SBO--suspect internal hernia. No frank volvulus.  Micro Data:  n/a  Antimicrobials:   n/a  Summary  64 yo male who underwent an uncomplicated LTHA on 5/4 and developed progressive abd distension, n/v who was admitted to [facility] on 5/10 for medical management of SBO.  He unfortunately failed medical management and underwent diagnostic laparoscopy on 5/13. Resolved hospital problems  AKI  Assessment & Plan:  Active Problems:   SBO (small bowel obstruction) ([place])  Surgically cleared for discharge. See Discharge Summary for post hospital recommendations.  SBO s/p LTHA. POD #4 laproscopic lysis of adhesions  Plan: Surgically cleared for discharge.  Hypokalemia. 3.1 this morning. Mg wnl. Suspect may be due to GI losses and should improve. Will replete with po K prior to discharge.  History of recurrent PE's   Plan: transitioning from heparin to warfarin. Will discharge with lovenox bridge  Chronic/Stable medical problems   Left Total Hip Arthroplasty (03/22/20). Ortho consulted on admission. WBAT. No hip precautions. HTN. Blood pressures stable.  Best practice:  CODE STATUS: FULL Diet: per surgery Dispo: surgically clear for discharge  Mitzi Hansen, MD [place] PGY-1 PAGER #: [phone] 04/05/20  10:29 AM  Labs    CBC Latest Ref Rng & Units 04/05/2020 04/04/2020 04/03/2020  WBC 4.0 - 10.5 K/uL 9.4 9.7 13.0(H)  Hemoglobin 13.0 - 17.0 g/dL 9.7(L) 9.7(L) 9.6(L)  Hematocrit 39.0 - 52.0 % 31.3(L) 31.1(L) 31.3(L)  Platelets 150 - 400 K/uL 170 164 161   BMP Latest Ref Rng & Units 04/05/2020 04/04/2020 04/03/2020  Glucose 70 - 99 mg/dL 128(H) 125(H) 102(H)  BUN 8 - 23 mg/dL 11 12 14   Creatinine 0.61 - 1.24 mg/dL 1.02 1.06 1.04  Sodium 135 -  145 mmol/L 140 144 149(H)  Potassium 3.5 - 5.1 mmol/L 3.1(L) 3.2(L) 3.5  Chloride 98 - 111 mmol/L 107 109 115(H)  CO2 22 - 32 mmol/L 22 23 26   Calcium 8.9 - 10.3 mg/dL 8.1(L) 8.1(L) 8.2(L)

## 2020-04-05 NOTE — Progress Notes (Signed)
[place] for heparin / warfarin Indication: hx VTE  Allergies  Allergen Reactions  . Iodine Hives    Patient Measurements: Height: 6\' 1"  (185.4 cm) Weight: 124.7 kg (274 lb 14.6 oz) IBW/kg (Calculated) : 79.9 Heparin Dosing Weight: 107.3kg  Vital Signs: Temp: 98.4 F (36.9 C) (05/18 0745) Temp Source: Oral (05/18 0745) BP: 133/80 (05/18 0745) Pulse Rate: 79 (05/18 0745)  Labs: Recent Labs    04/03/20 0246 04/03/20 0246 04/03/20 0959 04/04/20 0247 04/04/20 0702 04/05/20 0319 04/05/20 0331  HGB 9.6*   < >  --  9.7*  --  9.7*  --   HCT 31.3*  --   --  31.1*  --  31.3*  --   PLT 161  --   --  164  --  170  --   LABPROT  --   --   --   --   --  14.8  --   INR  --   --   --   --   --  1.2  --   HEPARINUNFRC 0.28*   < > 0.41 0.37  --  0.25*  --   CREATININE 1.04  --   --   --  1.06  --  1.02   < > = values in this interval not displayed.    Estimated Creatinine Clearance: 99.9 mL/min (by C-G formula based on SCr of 1.02 mg/dL).   Assessment: 18 yom presented to the ED with abdominal pain, found to have a SBO. He is on chronic warfarin for history for VTE.  Patient had surgery on 5/4 and Pharmacy asked to dose IV heparin.  S/p lysis of adhesions/enterotomy,  Heparin restarted.  Heparin level now low at 0.25, warfarin resumed 5/17 (dose prior to admission -> 10 mg daily)  Goal of Therapy:  Heparin level 0.3-0.7 units/ml Monitor platelets by anticoagulation protocol: Yes  INR = 2 to 3   Plan:  Increase heparin to 1950 units / hr Monitor daily HL, CBC, s/sx bleeding Repeat Warfarin 12.5 mg po x 1  Thank you Anette Guarneri, PharmD 04/05/2020  8:08 AM  Please check [email or web address] for unit-specific pharmacy phone numbers.

## 2020-04-05 NOTE — Discharge Instructions (Signed)
Please follow up with your PCP in 2-3d to have you potassium rechecked.  Your warfarin was stopped during this admission due to increased risk for need to go to the OR. We restarted it yesterday (5/17), however, given your recurrent pulmonary embolisms (blood clots in your lungs), we would like you to take lovenox for a few days while the warfarin levels build back up in your system. Lovenox is a type of heparin that is a daily injection.         [facility] ______[facility] LAPAROSCOPIC SURGERY: POST OP INSTRUCTIONS Always review your discharge instruction sheet given to you by the facility where your surgery was performed. IF YOU HAVE DISABILITY OR FAMILY LEAVE FORMS, YOU MUST BRING THEM TO THE OFFICE FOR PROCESSING.   DO NOT GIVE THEM TO YOUR DOCTOR.  1.  take acetaminophen (Tylenol) or ibuprofen (Advil) as needed. If pain is not controlled with these medications, THEN take prescription pain medicine as prescribed. 2. Take your usually prescribed medications unless otherwise directed. 3. If you need a refill on your pain medication, please contact your pharmacy.  They will contact our office to request authorization. Prescriptions will not be filled after 5pm or on week-ends. 4. You should follow a light diet the first few days after arrival home, such as soup and crackers, etc.  Be sure to include lots of fluids daily. 5. Most patients will experience some swelling and bruising in the area of the incisions.  Ice packs will help.  Swelling and bruising can take several days to resolve.  6. It is common to experience some constipation if taking pain medication after surgery.  Increasing fluid intake and taking a stool softener (such as Colace) will usually help or prevent this problem from occurring.  A mild laxative (Milk of Magnesia or Miralax) should be taken according to package instructions if there are no bowel movements after 48 hours. 7. Unless discharge instructions  indicate otherwise, you may remove your bandages 24-48 hours after surgery, and you may shower at that time.  You may have steri-strips (small skin tapes) in place directly over the incision.  These strips should be left on the skin for 7-10 days.  If your surgeon used skin glue on the incision, you may shower in 24 hours.  The glue will flake off over the next 2-3 weeks.  Any sutures or staples will be removed at the office during your follow-up visit. 8. ACTIVITIES:  You may resume regular (light) daily activities beginning the next day--such as daily self-care, walking, climbing stairs--gradually increasing activities as tolerated.  You may have sexual intercourse when it is comfortable.  Refrain from any heavy lifting or straining until approved by your doctor. a. You may drive when you are no longer taking prescription pain medication, you can comfortably wear a seatbelt, and you can safely maneuver your car and apply brakes. b. RETURN TO WORK:  __________________________________________________________ 9. You should see your doctor in the office for a follow-up appointment approximately 2-3 weeks after your surgery.  Make sure that you call for this appointment within a day or two after you arrive home to insure a convenient appointment time. 10. OTHER INSTRUCTIONS: __________________________________________________________________________________________________________________________ __________________________________________________________________________________________________________________________ WHEN TO CALL YOUR DOCTOR: 1. Fever over 101.0 2. Inability to urinate 3. Continued bleeding from incision. 4. Increased pain, redness, or drainage from the incision. 5. Increasing abdominal pain  The clinic staff is available to answer your questions during regular business hours.  Please dont hesitate to  call and ask to speak to one of the nurses for clinical concerns.  If you have a medical  emergency, go to the nearest emergency room or call 911.  A surgeon from [facility] is always on call at the hospital. [address], [place], [place], [place]  [address] ? [address], [place], [place]   [address] [phone] ? [phone] ? FAX [phone] Web site: [email or web address]

## 2020-04-05 NOTE — Progress Notes (Signed)
Mobility Specialist: Progress Note    04/05/20 1333  Mobility  Activity Ambulated in hall  Level of Assistance Contact guard assist, steadying assist  Assistive Device Front wheel walker  Distance Ambulated (ft) 410 ft  Mobility Response Tolerated well  Mobility performed by Mobility specialist  $Mobility charge 1 Mobility   Pt tolerated ambulation well. Pt needed to go to the bathroom upon returning to the room with wife assisting him.  [facility] Joshual Terrio Mobility Specialist

## 2020-04-06 NOTE — Discharge Summary (Signed)
Name: Dwayne Lawrence MRN: [id number] DOB: December 03, 1955 64 y.o. PCP: [name]., MD  Date of Admission: 03/28/2020  5:23 AM Date of Discharge: 04/05/2020 Attending Physician: No att. providers found  Discharge Diagnosis: 1. Small bowel obstruction 2. History of recurrent pulmonary embolisms  Discharge Medications: Allergies as of 04/05/2020      Reactions   Iodine Hives      Medication List    STOP taking these medications   methocarbamol 500 MG tablet Commonly known as: ROBAXIN   oxyCODONE 5 MG immediate release tablet Commonly known as: Oxy IR/ROXICODONE     TAKE these medications   acetaminophen 325 MG tablet Commonly known as: TYLENOL Take 650 mg by mouth every 6 (six) hours as needed for mild pain.   allopurinol 100 MG tablet Commonly known as: ZYLOPRIM Take 100 mg by mouth 2 (two) times daily.   amLODipine 5 MG tablet Commonly known as: NORVASC Take 5 mg by mouth every morning.   enoxaparin 120 MG/0.8ML injection Commonly known as: LOVENOX Inject 0.8 mLs (120 mg total) into the skin 2 (two) times daily for 5 days.   warfarin 10 MG tablet Commonly known as: COUMADIN Take 10 mg by mouth daily.       Disposition and follow-up:   Mr.[name] was discharged from [facility] in Stable condition.  At the hospital follow up visit please address:  1.  Small bowel obstruction status post laparoscopic lysis of adhesions and enterotomy repair x2 on 5/13.  Occurred in the setting of recent right hip replacement.  Had his first bowel movement on 5/15.  Uncomplicated hospitalization. -Follow-up with general surgery  2.  History of recurrent pulmonary embolisms on warfarin.  Warfarin was held on admission due to likelihood of requiring surgery.  He received IV heparin throughout his hospitalization until Coumadin was restarted on 5/14.  He was bridged with heparin and INR on day of discharge was 1.2.  He is given instructions to continue  Lovenox until he follows up with his PCP for INR recheck and Coumadin adjustments. -Consider discussing alternative anticoagulation methods including possibly a DOAC.  2.  Labs / imaging needed at time of follow-up: BMP  3.  Pending labs/ test needing follow-up: None  Follow-up Appointments: Follow-up Information    [name], [name], MD Follow up.   Specialty: General Surgery Why: call to confirm post-operative appointment date/time. Contact information: [place] [address] [phone]        [name]., MD Follow up in 3 day(s).   Specialty: Family Medicine Contact information: [address] [address] [phone]           Hospital Course: 65 year old male with past medical history of recurrent pulmonary embolisms now on warfarin and a recent left total hip arthroplasty on 5/4.  Hospital admission for the hip replacement was uncomplicated and patient was discharged the following day however he unfortunately developed worsening abdominal pain and bloating and presented to the ED on 5/10 for this.  Imaging on admission revealed a small bowel obstruction at which time general surgery was consulted and recommended attempting medical management.  Unfortunately he failed medical management and ultimately underwent laparoscopy with enterectomy x2 with lysis of adhesions on 5/13.  He improved fairly quickly after surgery and had a bowel movement on 5/15.  NG was clamped and removed on 5/16.  His diet was advanced and he was tolerating diet well on day of discharge.  Discharge Vitals:   BP 132/79 (BP Location: Left Arm)   Pulse 84   Temp 98.5 F (36.9 C) (Oral)   Resp (!) 21   Ht 6\' 1"  (1.854 m)   Wt 124.7 kg   SpO2 94%   BMI 36.27 kg/m   Pertinent Labs, Studies, and Procedures:   5/10 CT a/p: SBO with transition zone in the mid jejuna level slightly to the right of the midline in the mid abdomen. No free air. Somewhat  swirl like appearance of the mid abd mesentary and vessels in mid abdomen near site of SBO--suspect internal hernia. No frank volvulus.  CBC Latest Ref Rng & Units 04/05/2020 04/04/2020 04/03/2020  WBC 4.0 - 10.5 K/uL 9.4 9.7 13.0(H)  Hemoglobin 13.0 - 17.0 g/dL 9.7(L) 9.7(L) 9.6(L)  Hematocrit 39.0 - 52.0 % 31.3(L) 31.1(L) 31.3(L)  Platelets 150 - 400 K/uL 170 164 161   BMP Latest Ref Rng & Units 04/05/2020 04/04/2020 04/03/2020  Glucose 70 - 99 mg/dL 128(H) 125(H) 102(H)  BUN 8 - 23 mg/dL 11 12 14   Creatinine 0.61 - 1.24 mg/dL 1.02 1.06 1.04  Sodium 135 - 145 mmol/L 140 144 149(H)  Potassium 3.5 - 5.1 mmol/L 3.1(L) 3.2(L) 3.5  Chloride 98 - 111 mmol/L 107 109 115(H)  CO2 22 - 32 mmol/L 22 23 26   Calcium 8.9 - 10.3 mg/dL 8.1(L) 8.1(L) 8.2(L)     Discharge Instructions: Discharge Instructions    Increase activity slowly   Complete by: As directed       Signed: Mitzi Hansen, MD 04/06/2020, 3:34 PM   Pager: [phone]

## 2020-04-13 ENCOUNTER — Other Ambulatory Visit: Payer: Self-pay

## 2020-04-13 ENCOUNTER — Encounter: Payer: Self-pay | Admitting: Orthopaedic Surgery

## 2020-04-13 ENCOUNTER — Ambulatory Visit (INDEPENDENT_AMBULATORY_CARE_PROVIDER_SITE_OTHER): Payer: Medicare HMO | Admitting: Orthopaedic Surgery

## 2020-04-13 DIAGNOSIS — Z96642 Presence of left artificial hip joint: Secondary | ICD-10-CM

## 2020-04-13 NOTE — Progress Notes (Signed)
The patient is now 3 weeks status post a left total hip arthroplasty.  He is doing well overall.  He is ambulating with a walker.  He is not needing pain medication.  He has not had home therapy but feels like between he and his wife in a home exercise program that he is making progress.  On exam his staple line looks good so remove the staples in place Steri-Strips.  He does have a moderate seroma noted aspirate about 80 cc of fluid from the soft tissue.  This gave him good relief.  His ligaments are equal.  He will continue to increase his activities as comfort allows.  He will transition from walker to cane when he is comfortable.  We would like to see him back in 4 weeks to see how he is doing overall from a mobility standpoint but no x-rays are needed.  If there is any issues before then he will let us know.

## 2020-05-11 ENCOUNTER — Ambulatory Visit (INDEPENDENT_AMBULATORY_CARE_PROVIDER_SITE_OTHER): Payer: Medicare HMO | Admitting: Orthopaedic Surgery

## 2020-05-11 ENCOUNTER — Other Ambulatory Visit: Payer: Self-pay

## 2020-05-11 ENCOUNTER — Encounter: Payer: Self-pay | Admitting: Orthopaedic Surgery

## 2020-05-11 DIAGNOSIS — Z96642 Presence of left artificial hip joint: Secondary | ICD-10-CM

## 2020-05-11 NOTE — Progress Notes (Signed)
HPI: [name] returns today now 6 weeks status post left total hip arthroplasty is overall doing well. He does have some numbness to touch around the incision site. Said no fevers chills. He is on chronic anticoagulation. He notes some stiffness when he first gets up to ambulate. He is using a cane but often forgets walks off without the cane. Feels his range of motion strength overall improving.  Physical exam: Left hip surgical incisions well-healed no signs of infection or dehiscence. Slight edema consistent with seroma. Aspirations performed 11 cc of serosanguineous fluids obtained patient tolerates well. Left calf supple nontender. Slight swelling of the left ankle but no ecchymosis. Left hip fluid motion limited internal and external rotation without pain.  Impression: Status post left total hip arthroplasty 03/22/2020  Plan: She will continue work on range of motion strengthening. Work on scar tissue mobilization and desensitizing the hip. Follow-up with [place] in 6 months AP pelvis lateral view of the left hip at that time. Questions encouraged and answered by Dr. Ninfa Lawrence and myself.

## 2020-11-21 ENCOUNTER — Ambulatory Visit: Payer: Medicare HMO | Admitting: Orthopaedic Surgery

## 2020-11-22 ENCOUNTER — Ambulatory Visit: Payer: Medicare HMO | Admitting: Orthopaedic Surgery

## 2020-11-22 ENCOUNTER — Ambulatory Visit (INDEPENDENT_AMBULATORY_CARE_PROVIDER_SITE_OTHER): Payer: Medicare HMO

## 2020-11-22 ENCOUNTER — Encounter: Payer: Self-pay | Admitting: Orthopaedic Surgery

## 2020-11-22 DIAGNOSIS — Z96642 Presence of left artificial hip joint: Secondary | ICD-10-CM | POA: Diagnosis not present

## 2020-11-22 NOTE — Progress Notes (Signed)
Office Visit Note   Patient: Dwayne Lawrence           Date of Birth: 05/18/1956           MRN: [id number] Visit Date: 11/22/2020              Requested by: [name], MD [address] [place],  [place] [address] PCP: [name], MD   Assessment & Plan: Visit Diagnoses:  1. Status post total replacement of left hip     Plan: I went over his exam findings and x-ray findings with him.  He is doing well enough to follow-up can be as needed.  If he does develop any issues with his right hip he knows to let us know or if there is any other issues with the left hip he says he will be happy to let us know.  Follow-Up Instructions: Return if symptoms worsen or fail to improve.   Orders:  Orders Placed This Encounter  Procedures  . XR HIP UNILAT W OR W/O PELVIS 1V LEFT   No orders of the defined types were placed in this encounter.     Procedures: No procedures performed   Clinical Data: No additional findings.   Subjective: Chief Complaint  Patient presents with  . Left Hip - Follow-up  The patient comes in today 8 months status post a left total hip arthroplasty.  Other than some numbness around his left hip incision he reports that he is feeling great.  He denies any issues with his right hip.  He said no other acute changes in medical status.  He walks without assistive device.  He is a very active 65 year old gentleman.  HPI  Review of Systems There is currently listed no headache, chest pain, shortness of breath, fever, chills, nausea, vomiting  Objective: Vital Signs: There were no vitals taken for this visit.  Physical Exam He is alert and orient x3 and in no acute distress Ortho Exam Examination of his left operative hip shows it moves smoothly and fluidly.  There is some slight stiffness with examination of his right hip. Specialty Comments:  No specialty comments available.  Imaging: XR HIP UNILAT W OR W/O PELVIS 1V LEFT  Result Date:  11/22/2020 An AP pelvis and lateral left hip shows a well-seated total hip arthroplasty with no complicating features.    PMFS History: Patient Active Problem List   Diagnosis Date Noted  . SBO (small bowel obstruction) (HCC) 03/28/2020  . Status post total replacement of left hip 03/22/2020  . Unilateral primary osteoarthritis, left hip 02/29/2020  . HCAP (healthcare-associated pneumonia) 10/15/2013  . S/P prostatectomy 10/15/2013  . SOB (shortness of breath) 10/15/2013  . HTN (hypertension) 10/15/2013   Past Medical History:  Diagnosis Date  . Arthritis   . DVT (deep venous thrombosis) (HCC)    right PT and peroneal veins DVT 10/16/13   . Gout no recent flares  . Hypertension   . Prostate cancer (HCC)   . Pulmonary embolus (HCC)    right lung 2009; bilateral PE 10/15/13 post-prostatectomy  . Sleep apnea    does not use cpap, last sleep study more than 3 yrs ago    History reviewed. No pertinent family history.  Past Surgical History:  Procedure Laterality Date  . APPENDECTOMY    . LAPAROSCOPIC LYSIS OF ADHESIONS N/A 03/31/2020   Procedure: LAPAROSCOPIC LYSIS OF ADHESIONS;  Surgeon: [name], De Blanch, MD;  Location: [facility];  Service: General;  Laterality:  N/A;  . LYMPHADENECTOMY Bilateral 10/07/2013   Procedure: LYMPHADENECTOMY " BILATERAL PELVIC LYMPH NODE DISSECTION";  Surgeon: Bernestine Amass, MD;  Location: [facility];  Service: Urology;  Laterality: Bilateral;  . PROSTATE BIOPSY  2013 and oct 2014  . ROBOT ASSISTED LAPAROSCOPIC RADICAL PROSTATECTOMY N/A 10/07/2013   Procedure: ROBOTIC ASSISTED LAPAROSCOPIC RADICAL PROSTATECTOMY;  Surgeon: Bernestine Amass, MD;  Location: [facility];  Service: Urology;  Laterality: N/A;  . SMALL BOWEL REPAIR N/A 03/31/2020   Procedure: LAPAROSCOPIC SMALL BOWEL REPAIR TIMES TWO;  Surgeon: Kieth Brightly, [name], MD;  Location: [place];  Service: General;  Laterality: N/A;  . TOTAL HIP ARTHROPLASTY Left 03/22/2020   Procedure: LEFT TOTAL HIP ARTHROPLASTY  ANTERIOR APPROACH;  Surgeon: Mcarthur Rossetti, MD;  Location: [place];  Service: Orthopedics;  Laterality: Left;   Social History   Occupational History  . Not on file  Tobacco Use  . Smoking status: Former Smoker    Types: Pipe    Quit date: 11/19/1997    Years since quitting: 23.0  . Smokeless tobacco: Former Systems developer    Types: Marty date: 11/19/1997  Substance and Sexual Activity  . Alcohol use: Yes    Comment: 2 drinks per day  . Drug use: No  . Sexual activity: Not on file

## 2021-12-19 ENCOUNTER — Encounter: Payer: Self-pay | Admitting: Gastroenterology

## 2022-01-01 ENCOUNTER — Other Ambulatory Visit: Payer: Self-pay

## 2022-01-01 ENCOUNTER — Encounter: Payer: Self-pay | Admitting: Gastroenterology

## 2022-01-01 ENCOUNTER — Ambulatory Visit: Payer: Medicare HMO | Admitting: Gastroenterology

## 2022-01-01 VITALS — BP 140/86 | HR 62 | Ht 73.0 in | Wt 282.0 lb

## 2022-01-01 DIAGNOSIS — Z800 Family history of malignant neoplasm of digestive organs: Secondary | ICD-10-CM | POA: Diagnosis not present

## 2022-01-01 NOTE — Patient Instructions (Signed)
If you are age 68 or older, your body mass index should be between 23-30. Your Body mass index is 37.21 kg/m. If this is out of the aforementioned range listed, please consider follow up with your Primary Care Provider.  If you are age 13 or younger, your body mass index should be between 19-25. Your Body mass index is 37.21 kg/m. If this is out of the aformentioned range listed, please consider follow up with your Primary Care Provider.   __________________________________________________________  The [facility] providers would like to encourage you to use [facility] to communicate with providers for non-urgent requests or questions.  Due to long hold times on the telephone, sending your provider a message by [facility] may be a faster and more efficient way to get a response.  Please allow 48 business hours for a response.  Please remember that this is for non-urgent requests.   You will be contaced by our office prior to your procedure for directions on holding your Coumadin/Warfarin.  If you do not hear from our office 1 week prior to your scheduled procedure, please call [phone] to discuss.  Due to recent changes in healthcare laws, you may see the results of your imaging and laboratory studies on MyChart before your provider has had a chance to review them.  We understand that in some cases there may be results that are confusing or concerning to you. Not all laboratory results come back in the same time frame and the provider may be waiting for multiple results in order to interpret others.  Please give [place] 48 hours in order for your provider to thoroughly review all the results before contacting the office for clarification of your results.   We have given you a sample of Clenpiq for your colonoscopy  It was a pleasure to see you today!  Jackquline Denmark, M.D.

## 2022-01-01 NOTE — Progress Notes (Addendum)
Chief Complaint: For colonoscopy  Referring Provider:  [name]., MD      ASSESSMENT AND PLAN;   #1. FH colon ca (mom at age 68)  #2. H/O polyps.  Plan: -Colon -Hold coumadin 5 days before.  Clearance from Dr Wendie Agreste and also regarding Lovenox bridging.  Of note that he did not have Lovenox bridging previously.   Discussed risks & benefits of colonoscopy. Risks including rare perforation req laparotomy, bleeding after bx/polypectomy req blood transfusion, rarely missing neoplasms, risks of anesthesia/sedation, rare risk of damage to internal organs. Benefits outweigh the risks. Patient agrees to proceed. All the questions were answered. Pt consents to proceed. HPI:    Dwayne Lawrence is a 66 y.o. 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 Wendie Agreste), gout, HTN, OSA, prostate CA s/p radical prostatectomy 2014, appendicectomy, PSBO d/t adhesions s/p LOA 03/2020.  Here for colonoscopy.  No nausea, vomiting, heartburn, regurgitation, odynophagia or dysphagia.  No significant diarrhea or constipation (does take stool softeners twice a week).  No melena or hematochezia. No unintentional weight loss. No abdominal pain.  S/P L hip replacemnt May [id number] course complicated by PSBO, s/p laparoscopic lysis of adhesions, enterrorhaphy x 2.  Has family history of colon cancer-mom at age 67.   Past GI procedures Colonoscopy 06/03/2017 ([facility], with difficulty d/t redundant colon): Small internal hemorrhoids, otherwise normal colonoscopy.  Repeat in 3 years d/t difficulty in examination and family history.  Coumadin was held 5 days prior.  No Lovenox bridging.  Colonoscopy 05/2014: Colon polyps s/p polypectomy, internal hemorrhoids. Bx- Tas  Colonoscopy 01/2009 Dr. Kerby Moors to visualize cecum.  ACBE negative thereafter.  Past Medical History:  Diagnosis Date   Arthritis    DVT (deep venous thrombosis) (HCC)    right PT and peroneal veins DVT  10/16/13    Gout no recent flares   Hypertension    Prostate cancer [facility])    Pulmonary embolus ([place])    right lung 2009; bilateral PE 10/15/13 post-prostatectomy   Sleep apnea    does not use cpap, last sleep study more than 3 yrs ago    Past Surgical History:  Procedure Laterality Date   APPENDECTOMY     LAPAROSCOPIC LYSIS OF ADHESIONS N/A 03/31/2020   Procedure: LAPAROSCOPIC LYSIS OF ADHESIONS;  Surgeon: Kieth Brightly [name], MD;  Location: [place];  Service: General;  Laterality: N/A;   LYMPHADENECTOMY Bilateral 10/07/2013   Procedure: LYMPHADENECTOMY " BILATERAL PELVIC LYMPH NODE DISSECTION";  Surgeon: Bernestine Amass, MD;  [facility];  Service: Urology;  Laterality: Bilateral;   PROSTATE BIOPSY  2013 and oct 2014   ROBOT ASSISTED LAPAROSCOPIC RADICAL PROSTATECTOMY N/A 10/07/2013   Procedure: ROBOTIC ASSISTED LAPAROSCOPIC RADICAL PROSTATECTOMY;  Surgeon: Bernestine Amass, MD;  Location: [facility];  Service: Urology;  Laterality: N/A;   SMALL BOWEL REPAIR N/A 03/31/2020   Procedure: LAPAROSCOPIC SMALL BOWEL REPAIR TIMES TWO;  Surgeon: [name], [name], MD;  Location: [place];  Service: General;  Laterality: N/A;   TOTAL HIP ARTHROPLASTY Left 03/22/2020   Procedure: LEFT TOTAL HIP ARTHROPLASTY ANTERIOR APPROACH;  Surgeon: Mcarthur Rossetti, MD;  Location: [place];  Service: Orthopedics;  Laterality: Left;    Family History  Problem Relation Age of Onset   Colon cancer Mother    Stomach cancer Maternal Grandmother    Rectal cancer Neg Hx    Liver cancer Neg Hx    Pancreatic cancer Neg Hx     Social History  Tobacco Use   Smoking status: Former    Types: Pipe    Quit date: 11/19/1997    Years since quitting: 24.1   Smokeless tobacco: Former    Types: Chew    Quit date: 11/19/1997  Vaping Use   Vaping Use: Never used  Substance Use Topics   Alcohol use: Yes    Comment: 2 drinks per day   Drug use: No    Current Outpatient Medications  Medication Sig Dispense Refill    allopurinol (ZYLOPRIM) 100 MG tablet Take 100 mg by mouth 2 (two) times daily.      amLODipine (NORVASC) 5 MG tablet Take 5 mg by mouth every morning.     warfarin (COUMADIN) 10 MG tablet Take 10 mg by mouth daily.     No current facility-administered medications for this visit.    Allergies  Allergen Reactions   Iodine Hives    Review of Systems:  Constitutional: Denies fever, chills, diaphoresis, appetite change and fatigue.  HEENT: Denies photophobia, eye pain, redness, hearing loss, ear pain, congestion, sore throat, rhinorrhea, sneezing, mouth sores, neck pain, neck stiffness and tinnitus.   Respiratory: Denies SOB, DOE, cough, chest tightness,  and wheezing.   Cardiovascular: Denies chest pain, palpitations and leg swelling.  Genitourinary: Denies dysuria, urgency, frequency, hematuria, flank pain and difficulty urinating.  Musculoskeletal: Denies myalgias, back pain, joint swelling, arthralgias and gait problem.  Skin: No rash.  Neurological: Denies dizziness, seizures, syncope, weakness, light-headedness, numbness and headaches.  Hematological: Denies adenopathy. Easy bruising, personal or family bleeding history  Psychiatric/Behavioral: No anxiety or depression     Physical Exam:    BP 140/86    Pulse 62    Ht 6\' 1"  (1.854 m)    Wt 282 lb (127.9 kg)    SpO2 97%    BMI 37.21 kg/m  Wt Readings from Last 3 Encounters:  01/01/22 282 lb (127.9 kg)  03/28/20 274 lb 14.6 oz (124.7 kg)  03/22/20 275 lb (124.7 kg)   Constitutional:  Well-developed, in no acute distress. Psychiatric: Normal mood and affect. Behavior is normal. HEENT: Pupils normal.  Conjunctivae are normal. No scleral icterus. Neck supple.  Cardiovascular: Normal rate, regular rhythm. No edema Pulmonary/chest: Effort normal and breath sounds normal. No wheezing, rales or rhonchi. Abdominal: Soft, nondistended. Nontender. Bowel sounds active throughout. There are no masses palpable. No hepatomegaly. Rectal:  Deferred Neurological: Alert and oriented to person place and time. Skin: Skin is warm and dry. No rashes noted.  Data Reviewed: I have personally reviewed following labs and imaging studies  CBC: CBC Latest Ref Rng & Units 04/05/2020 04/04/2020 04/03/2020  WBC 4.0 - 10.5 K/uL 9.4 9.7 13.0(H)  Hemoglobin 13.0 - 17.0 g/dL 9.7(L) 9.7(L) 9.6(L)  Hematocrit 39.0 - 52.0 % 31.3(L) 31.1(L) 31.3(L)  Platelets 150 - 400 K/uL 170 164 161    CMP: CMP Latest Ref Rng & Units 04/05/2020 04/04/2020 04/03/2020  Glucose 70 - 99 mg/dL 128(H) 125(H) 102(H)  BUN 8 - 23 mg/dL 11 12 14   Creatinine 0.61 - 1.24 mg/dL 1.02 1.06 1.04  Sodium 135 - 145 mmol/L 140 144 149(H)  Potassium 3.5 - 5.1 mmol/L 3.1(L) 3.2(L) 3.5  Chloride 98 - 111 mmol/L 107 109 115(H)  CO2 22 - 32 mmol/L 22 23 26   Calcium 8.9 - 10.3 mg/dL 8.1(L) 8.1(L) 8.2(L)  Total Protein 6.5 - 8.1 g/dL - - -  Total Bilirubin 0.3 - 1.2 mg/dL - - -  Alkaline Phos 38 - 126 U/L - - -  AST 15 - 41 U/L - - -  ALT 0 - 44 U/L - - -       Carmell Austria, MD 01/01/2022, 10:21 AM  Cc: [name]., MD

## 2022-01-10 ENCOUNTER — Other Ambulatory Visit: Payer: Self-pay

## 2022-01-10 ENCOUNTER — Encounter: Payer: Self-pay | Admitting: Orthopaedic Surgery

## 2022-01-10 ENCOUNTER — Ambulatory Visit (HOSPITAL_COMMUNITY)
Admission: RE | Admit: 2022-01-10 | Discharge: 2022-01-10 | Disposition: A | Discharge status: Home / Self Care | Payer: Medicare HMO | Source: Ambulatory Visit | Attending: Physician Assistant | Admitting: Physician Assistant

## 2022-01-10 ENCOUNTER — Ambulatory Visit (INDEPENDENT_AMBULATORY_CARE_PROVIDER_SITE_OTHER): Payer: Medicare HMO | Admitting: Orthopaedic Surgery

## 2022-01-10 DIAGNOSIS — M79662 Pain in left lower leg: Secondary | ICD-10-CM

## 2022-01-10 DIAGNOSIS — M7989 Other specified soft tissue disorders: Secondary | ICD-10-CM | POA: Diagnosis not present

## 2022-01-10 NOTE — Progress Notes (Signed)
HPI: Mr. Reznik comes in today with left leg swelling for last 2 weeks.  No known injury.  The states swelling started in his ankle now it involves the whole left lower leg.  He does have a history of blood clots and is on chronic Coumadin.  Unsure of his last INR.  The only INR in the chart is from May 2021 and at that time he was subtherapeutic with an INR of 1.2.  He states he is having tenderness in his left calf.  Denies any chest pain shortness of breath.  Does have a history of gout has had no significant redness of the lower leg or hyperparesthesia.  Review of systems see HPI otherwise negative  Physical exam: Right leg no edema throughout.  Left lower leg pitting edema with tenderness in the calf region with palpation.  Full range of motion left knee without pain.  Full range of motion at the left ankle without pain.  Dorsal pedal pulses 2+ on the left.  No rashes skin lesions ulcerations or impending ulcers left lower leg.  Impression: Left leg edema  Plan: The patient will have his INR checked at his primary care physician's office soon as possible.  We will order a Doppler of his left lower leg to rule out DVT.  Be in touch with him about the results of this.  Questions were encouraged and answered by Dr. Ninfa Linden and myself.

## 2022-01-10 NOTE — Progress Notes (Signed)
LLE venous duplex has been completed.  Preliminary findings given to Dr. Ninfa Linden (ordering physician) & Dr. Wendie Agreste (PCP).   Results can be found under chart review under CV PROC. 01/10/2022 11:46 AM Jaiven Graveline RVT, RDMS

## 2022-01-24 ENCOUNTER — Encounter: Payer: Medicare HMO | Admitting: Gastroenterology

## 2022-01-25 ENCOUNTER — Emergency Department (HOSPITAL_COMMUNITY)
Admission: EM | Admit: 2022-01-25 | Discharge: 2022-01-25 | Disposition: A | Discharge status: Home / Self Care | Payer: Medicare HMO | Attending: Student | Admitting: Student

## 2022-01-25 ENCOUNTER — Emergency Department (HOSPITAL_COMMUNITY): Payer: Medicare HMO

## 2022-01-25 ENCOUNTER — Encounter (HOSPITAL_COMMUNITY): Payer: Self-pay | Admitting: Emergency Medicine

## 2022-01-25 ENCOUNTER — Other Ambulatory Visit: Payer: Self-pay

## 2022-01-25 DIAGNOSIS — R079 Chest pain, unspecified: Secondary | ICD-10-CM | POA: Diagnosis not present

## 2022-01-25 DIAGNOSIS — Z7901 Long term (current) use of anticoagulants: Secondary | ICD-10-CM | POA: Diagnosis not present

## 2022-01-25 DIAGNOSIS — Z20822 Contact with and (suspected) exposure to covid-19: Secondary | ICD-10-CM | POA: Insufficient documentation

## 2022-01-25 DIAGNOSIS — I10 Essential (primary) hypertension: Secondary | ICD-10-CM | POA: Insufficient documentation

## 2022-01-25 DIAGNOSIS — R0602 Shortness of breath: Secondary | ICD-10-CM | POA: Insufficient documentation

## 2022-01-25 DIAGNOSIS — M7989 Other specified soft tissue disorders: Secondary | ICD-10-CM | POA: Diagnosis present

## 2022-01-25 DIAGNOSIS — I824Y9 Acute embolism and thrombosis of unspecified deep veins of unspecified proximal lower extremity: Secondary | ICD-10-CM

## 2022-01-25 DIAGNOSIS — I824Y3 Acute embolism and thrombosis of unspecified deep veins of proximal lower extremity, bilateral: Secondary | ICD-10-CM | POA: Diagnosis not present

## 2022-01-25 DIAGNOSIS — Z8546 Personal history of malignant neoplasm of prostate: Secondary | ICD-10-CM | POA: Diagnosis not present

## 2022-01-25 DIAGNOSIS — Z87891 Personal history of nicotine dependence: Secondary | ICD-10-CM | POA: Diagnosis not present

## 2022-01-25 DIAGNOSIS — Z79899 Other long term (current) drug therapy: Secondary | ICD-10-CM | POA: Diagnosis not present

## 2022-01-25 LAB — RESP PANEL BY RT-PCR (FLU A&B, COVID) ARPGX2
Influenza A by PCR: NEGATIVE
Influenza B by PCR: NEGATIVE
SARS Coronavirus 2 by RT PCR: NEGATIVE

## 2022-01-25 LAB — TROPONIN I (HIGH SENSITIVITY): Troponin I (High Sensitivity): 4 ng/L

## 2022-01-25 LAB — CBC WITH DIFFERENTIAL/PLATELET
Abs Immature Granulocytes: 0.03 10*3/uL (ref 0.00–0.07)
Basophils Absolute: 0 10*3/uL (ref 0.0–0.1)
Basophils Relative: 0 %
Eosinophils Absolute: 0.2 10*3/uL (ref 0.0–0.5)
Eosinophils Relative: 2 %
HCT: 43.9 % (ref 39.0–52.0)
Hemoglobin: 14.1 g/dL (ref 13.0–17.0)
Immature Granulocytes: 0 %
Lymphocytes Relative: 31 %
Lymphs Abs: 2.4 10*3/uL (ref 0.7–4.0)
MCH: 29.7 pg (ref 26.0–34.0)
MCHC: 32.1 g/dL (ref 30.0–36.0)
MCV: 92.6 fL (ref 80.0–100.0)
Monocytes Absolute: 0.5 10*3/uL (ref 0.1–1.0)
Monocytes Relative: 6 %
Neutro Abs: 4.6 10*3/uL (ref 1.7–7.7)
Neutrophils Relative %: 61 %
Platelets: 145 10*3/uL — ABNORMAL LOW (ref 150–400)
RBC: 4.74 MIL/uL (ref 4.22–5.81)
RDW: 13.2 % (ref 11.5–15.5)
WBC: 7.7 10*3/uL (ref 4.0–10.5)
nRBC: 0 % (ref 0.0–0.2)

## 2022-01-25 LAB — BASIC METABOLIC PANEL
Anion gap: 6 (ref 5–15)
BUN: 20 mg/dL (ref 8–23)
CO2: 24 mmol/L (ref 22–32)
Calcium: 9.3 mg/dL (ref 8.9–10.3)
Chloride: 109 mmol/L (ref 98–111)
Creatinine, Ser: 0.91 mg/dL (ref 0.61–1.24)
GFR, Estimated: >60 mL/min (ref >60)
Glucose, Bld: 96 mg/dL (ref 70–99)
Potassium: 4.2 mmol/L (ref 3.5–5.1)
Sodium: 139 mmol/L (ref 135–145)

## 2022-01-25 MED ORDER — IOHEXOL 350 MG/ML SOLN
80.0000 mL | Freq: Once | INTRAVENOUS | Status: AC | PRN
  Administered 2022-01-25: 15:00:00 80 mL via INTRAVENOUS

## 2022-01-25 NOTE — ED Notes (Signed)
Pt ambulatory to restroom

## 2022-01-25 NOTE — ED Provider Triage Note (Signed)
Emergency Medicine Provider Triage Evaluation Note ? ?Dwayne Lawrence , a 66 y.o. male  was evaluated in triage.  Pt complains of continued left leg swelling, pain with known history of DVT.  He has had multiple blood clots while on warfarin.  He was recently switched to Xarelto.  His doctors are talking about placing an IVC filter.  This time he reports he is having some burning on the right side of his chest which is similar to previous pulmonary embolisms.  He has been taking his Xarelto as prescribed.  He endorses some shortness of breath.  He denies any hemoptysis.  He reports that he does have some kind of a clotting disorder but he is not sure what it is. ? ?Review of Systems  ?Positive: Lef pain swelling, known DVT, new Shob, chest burning on right ?Negative: Chest pain on left, chest pain at rest, hemoptysis ? ?Physical Exam  ?BP (!) 147/93 (BP Location: Left Arm)   Pulse 68   Temp 98.7 ?F (37.1 ?C) (Oral)   Resp 18   Ht '6\' 1"'$  (1.854 m)   Wt 127 kg   SpO2 97%   BMI 36.94 kg/m?  ?Gen:   Awake, no distress   ?Resp:  Normal effort  ?MSK:   Moves extremities without difficulty  ?Other:  Left leg edema, tenderness ? ?Medical Decision Making  ?Medically screening exam initiated at 1:03 PM.  Appropriate orders placed.  Dwayne Lawrence was informed that the remainder of the evaluation will be completed by another provider, this initial triage assessment does not replace that evaluation, and the importance of remaining in the ED until their evaluation is complete. ? ?Workup initiated ?  ?Anselmo Pickler, PA-C ?01/25/22 1305 ? ?

## 2022-01-25 NOTE — ED Triage Notes (Signed)
Reports continued leg swelling that has not since resolved since starting xeralto. Now reports SOB for the past few days, new onset. Also reports fatigue. States it feels like he has a blood clot in his lungs again.  ?

## 2022-01-25 NOTE — ED Provider Notes (Signed)
[facility] DEPT Provider Note  CSN: [id number] Arrival date & time: 01/25/22 1226  Chief Complaint(s) Leg Swelling and Shortness of Breath  HPI Dwayne Lawrence is a 66 y.o. 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  Patient was diagnosed with a DVT on 01/10/2022 and started on Xarelto.  He was referred to general surgery on 01/15/2022 for possible IVC filter placement which the patient declined.  He arrives today with complaints of mild shortness of breath on exertion and concern for pulmonary embolism.  He currently denies chest pain at rest, abdominal pain, nausea, vomiting or other systemic symptoms.   Shortness of Breath Associated symptoms: chest pain    Past Medical History Past Medical History:  Diagnosis Date   Arthritis    DVT (deep venous thrombosis) (HCC)    right PT and peroneal veins DVT 10/16/13    Gout no recent flares   Hypertension    Prostate cancer ([place])    Pulmonary embolus ([place])    right lung 2009; bilateral PE 10/15/13 post-prostatectomy   Sleep apnea    does not use cpap, last sleep study more than 3 yrs ago   Patient Active Problem List   Diagnosis Date Noted   SBO (small bowel obstruction) ([place]) 03/28/2020   Status post total replacement of left hip 03/22/2020   Unilateral primary osteoarthritis, left hip 02/29/2020   HCAP (healthcare-associated pneumonia) 10/15/2013   S/P prostatectomy 10/15/2013   SOB (shortness of breath) 10/15/2013   HTN (hypertension) 10/15/2013   Home Medication(s) Prior to Admission medications   Medication Sig Start Date End Date Taking? Authorizing Provider  allopurinol (ZYLOPRIM) 100 MG tablet Take 100 mg by mouth 2 (two) times daily.  02/02/20   [provider]  amLODipine (NORVASC) 5 MG tablet Take 5 mg by mouth every morning.    [provider]   warfarin (COUMADIN) 10 MG tablet Take 10 mg by mouth daily. 12/23/19   [provider]                                                                                                                                    Past Surgical History Past Surgical History:  Procedure Laterality Date   APPENDECTOMY     LAPAROSCOPIC LYSIS OF ADHESIONS N/A 03/31/2020   Procedure: LAPAROSCOPIC LYSIS OF ADHESIONS;  Surgeon: Kieth Brightly, [name], MD;  Location: [facility];  Service: General;  Laterality: N/A;   LYMPHADENECTOMY Bilateral 10/07/2013   Procedure: LYMPHADENECTOMY " BILATERAL PELVIC LYMPH NODE DISSECTION";  Surgeon: Bernestine Amass, MD;  [facility];  Service: Urology;  Laterality: Bilateral;   PROSTATE BIOPSY  2013 and oct 2014   ROBOT ASSISTED LAPAROSCOPIC RADICAL PROSTATECTOMY N/A 10/07/2013   Procedure: ROBOTIC ASSISTED LAPAROSCOPIC RADICAL PROSTATECTOMY;  Surgeon: Bernestine Amass, MD;  Location: [facility];  Service: Urology;  Laterality: N/A;   SMALL BOWEL REPAIR N/A 03/31/2020   Procedure: LAPAROSCOPIC SMALL BOWEL REPAIR TIMES TWO;  Surgeon: [name], [name], MD;  Location: [facility];  Service: General;  Laterality: N/A;   TOTAL HIP ARTHROPLASTY Left 03/22/2020   Procedure: LEFT TOTAL HIP ARTHROPLASTY ANTERIOR APPROACH;  Surgeon: Mcarthur Rossetti, MD;  Location: [facility];  Service: Orthopedics;  Laterality: Left;   Family History Family History  Problem Relation Age of Onset   Colon cancer Mother    Stomach cancer Maternal Grandmother    Rectal cancer Neg Hx    Liver cancer Neg Hx    Pancreatic cancer Neg Hx     Social History Social History   Tobacco Use   Smoking status: Former    Types: Pipe    Quit date: 11/19/1997    Years since quitting: 24.2   Smokeless tobacco: Former    Types: Chew    Quit date: 11/19/1997  Vaping Use   Vaping Use: Never used  Substance Use Topics   Alcohol use: Yes    Comment: 2 drinks per day   Drug use: No   Allergies Iodine  Review of  Systems Review of Systems  Respiratory:  Positive for shortness of breath.   Cardiovascular:  Positive for chest pain.   Physical Exam Vital Signs  I have reviewed the triage vital signs BP (!) 141/91    Pulse (!) 52    Temp 98.7 F (37.1 C) (Oral)    Resp 15    Ht '6\' 1"'$  (1.854 m)    Wt 127 kg    SpO2 100%    BMI 36.94 kg/m   Physical Exam Vitals and nursing note reviewed.  Constitutional:      General: He is not in acute distress.    Appearance: He is well-developed.  HENT:     Head: Normocephalic and atraumatic.  Eyes:     Conjunctiva/sclera: Conjunctivae normal.  Cardiovascular:     Rate and Rhythm: Normal rate and regular rhythm.     Heart sounds: No murmur heard. Pulmonary:     Effort: Pulmonary effort is normal. No respiratory distress.     Breath sounds: Normal breath sounds.  Abdominal:     Palpations: Abdomen is soft.     Tenderness: There is no abdominal tenderness.  Musculoskeletal:        General: No swelling.     Cervical back: Neck supple.     Right lower leg: Edema present.     Left lower leg: Tenderness present. Edema present.  Skin:    General: Skin is warm and dry.     Capillary Refill: Capillary refill takes less than 2 seconds.  Neurological:     Mental Status: He is alert.  Psychiatric:        Mood and Affect: Mood normal.    ED Results and Treatments Labs (all labs ordered are listed, but only abnormal results are displayed) Labs Reviewed  CBC WITH DIFFERENTIAL/PLATELET - Abnormal; Notable for the following components:      Result Value   Platelets 145 (*)    All other components within normal limits  RESP PANEL BY RT-PCR (FLU A&B, COVID) ARPGX2  BASIC METABOLIC PANEL  TROPONIN I (HIGH SENSITIVITY)  Radiology DG Chest 2 View  Result Date: 01/25/2022 CLINICAL DATA:  Shortness of breath EXAM: CHEST - 2 VIEW COMPARISON:   None. FINDINGS: The heart size and mediastinal contours are within normal limits. Both lungs are clear. No pleural effusion or pneumothorax. The visualized skeletal structures are unremarkable. IMPRESSION: No active cardiopulmonary disease. Electronically Signed   By: Macy Mis M.D.   On: 01/25/2022 13:22   CT Angio Chest PE W and/or Wo Contrast  Result Date: 01/25/2022 CLINICAL DATA:  Left leg swelling.  History of multiple blood clots. EXAM: CT ANGIOGRAPHY CHEST WITH CONTRAST TECHNIQUE: Multidetector CT imaging of the chest was performed using the standard protocol during bolus administration of intravenous contrast. Multiplanar CT image reconstructions and MIPs were obtained to evaluate the vascular anatomy. RADIATION DOSE REDUCTION: This exam was performed according to the departmental dose-optimization program which includes automated exposure control, adjustment of the mA and/or kV according to patient size and/or use of iterative reconstruction technique. CONTRAST:  61m OMNIPAQUE IOHEXOL 350 MG/ML SOLN COMPARISON:  10/15/2013 FINDINGS: Cardiovascular: The heart is normal in size. No pericardial effusion. The aorta is upper limits of normal in caliber. No dissection. Minimal atherosclerotic calcification at the aortic arch. No definite coronary artery calcifications. The pulmonary arterial tree is fairly well opacified. No filling defects to suggest pulmonary embolism. Mediastinum/Nodes: Small scattered mediastinal hilar lymph nodes but no mass or overt adenopathy. The esophagus is grossly normal. The thyroid gland is unremarkable. Lungs/Pleura: Streaky areas of subsegmental atelectasis but no focal infiltrates, edema or effusions. No worrisome pulmonary lesions or pulmonary nodules. Upper Abdomen: No significant upper abdominal findings. Musculoskeletal: No chest wall mass, supraclavicular or axillary adenopathy. The thyroid gland is unremarkable. The bony thorax is intact. No worrisome bone lesions.  Review of the MIP images confirms the above findings. IMPRESSION: 1. No CT findings for pulmonary embolism. 2. Normal thoracic aorta. 3. Streaky areas of subsegmental atelectasis but no focal infiltrates, edema or effusions. Electronically Signed   By: PMarijo SanesM.D.   On: 01/25/2022 15:22    Pertinent labs & imaging results that were available during my care of the patient were reviewed by me and considered in my medical decision making (see MDM for details).  Medications Ordered in ED Medications  iohexol (OMNIPAQUE) 350 MG/ML injection 80 mL (80 mLs Intravenous Contrast Given 01/25/22 1505)                                                                                                                                     Procedures Procedures  (including critical care time)  Medical Decision Making / ED Course   This patient presents to the ED for concern of chest pain shortness of breath, leg swelling, this involves an extensive number of treatment options, and is a complaint that carries with it a high risk of complications and morbidity.  The differential diagnosis includes DVT, PE, reactive airway disease  MDM: Seen in the emergency department for evaluation of exertional shortness of breath and leg swelling.  Physical exam reveals bilateral lower extremity edema left significantly greater than right at the site of his known DVT.  Cardiopulmonary exam unremarkable.  Laboratory evaluation unremarkable.  Troponin negative.  COVID and flu negative.  CT PE with no evidence of pulmonary embolism.  Chest x-ray negative.  I spoke with the patient's primary care physician who will help establish care with an outpatient hematologist for hypercoagulability work-up and follow-up patient after ER visit today.  Patient then discharged.   Additional history obtained: -Additional history obtained from wife -External records from outside source obtained and reviewed including: Chart review including  previous notes, labs, imaging, consultation notes   Lab Tests: -I ordered, reviewed, and interpreted labs.   The pertinent results include:   Labs Reviewed  CBC WITH DIFFERENTIAL/PLATELET - Abnormal; Notable for the following components:      Result Value   Platelets 145 (*)    All other components within normal limits  RESP PANEL BY RT-PCR (FLU A&B, COVID) ARPGX2  BASIC METABOLIC PANEL  TROPONIN I (HIGH SENSITIVITY)      EKG   EKG Interpretation  Date/Time:  Thursday January 25 2022 13:20:17 EST Ventricular Rate:  61 PR Interval:  172 QRS Duration: 92 QT Interval:  405 QTC Calculation: 408 R Axis:   -1 Text Interpretation: Sinus rhythm Confirmed by [place] ([id number]) on 01/25/2022 4:54:06 PM         Imaging Studies ordered: I ordered imaging studies including CXR, CTPE I independently visualized and interpreted imaging. I agree with the radiologist interpretation   Medicines ordered and prescription drug management: Meds ordered this encounter  Medications   iohexol (OMNIPAQUE) 350 MG/ML injection 80 mL    -I have reviewed the patients home medicines and have made adjustments as needed  Critical interventions none   Cardiac Monitoring: The patient was maintained on a cardiac monitor.  I personally viewed and interpreted the cardiac monitored which showed an underlying rhythm of: NSR  Social Determinants of Health:  Factors impacting patients care include: none   Reevaluation: After the interventions noted above, I reevaluated the patient and found that they have :improved  Co morbidities that complicate the patient evaluation  Past Medical History:  Diagnosis Date   Arthritis    DVT (deep venous thrombosis) (HCC)    right PT and peroneal veins DVT 10/16/13    Gout no recent flares   Hypertension    Prostate cancer ([place])    Pulmonary embolus ([place])    right lung 2009; bilateral PE 10/15/13 post-prostatectomy   Sleep apnea    does not use cpap,  last sleep study more than 3 yrs ago      Dispostion: I considered admission for this patient, and this is imaging is negative and walk of life with no tachycardia or hypoxia patient safe for discharge with outpatient follow-up.     Final Clinical Impression(s) / ED Diagnoses Final diagnoses:  Deep vein thrombosis (DVT) of proximal lower extremity, unspecified chronicity, unspecified laterality [facility])     '@PCDICTATION'$ @    Teressa Lower, MD 01/25/22 1654

## 2022-01-25 NOTE — ED Notes (Signed)
Pt ambulatory. 97% room air noted with ambulation  ?

## 2022-01-28 NOTE — Progress Notes (Incomplete)
[facility]  [address] [place],  [place]  [address] [phone]  Clinic Day:  01/28/2022  Referring physician: [name]., MD   HISTORY OF PRESENT ILLNESS:  The patient is a 66 y.o. male  *** who I was asked to consult upon for ***   PAST MEDICAL HISTORY:   Past Medical History:  Diagnosis Date   Arthritis    DVT (deep venous thrombosis) ([place])    right PT and peroneal veins DVT 10/16/13    Gout no recent flares   Hypertension    Prostate cancer ([place])    Pulmonary embolus ([place])    right lung 2009; bilateral PE 10/15/13 post-prostatectomy   Sleep apnea    does not use cpap, last sleep study more than 3 yrs ago    PAST SURGICAL HISTORY:   Past Surgical History:  Procedure Laterality Date   APPENDECTOMY     LAPAROSCOPIC LYSIS OF ADHESIONS N/A 03/31/2020   Procedure: LAPAROSCOPIC LYSIS OF ADHESIONS;  Surgeon: Kieth Brightly [name], MD;  Location: [facility];  Service: General;  Laterality: N/A;   LYMPHADENECTOMY Bilateral 10/07/2013   Procedure: LYMPHADENECTOMY " BILATERAL PELVIC LYMPH NODE DISSECTION";  Surgeon: Bernestine Amass, MD;  [facility];  Service: Urology;  Laterality: Bilateral;   PROSTATE BIOPSY  2013 and oct 2014   ROBOT ASSISTED LAPAROSCOPIC RADICAL PROSTATECTOMY N/A 10/07/2013   Procedure: ROBOTIC ASSISTED LAPAROSCOPIC RADICAL PROSTATECTOMY;  Surgeon: Bernestine Amass, MD;  [facility];  Service: Urology;  Laterality: N/A;   SMALL BOWEL REPAIR N/A 03/31/2020   Procedure: LAPAROSCOPIC SMALL BOWEL REPAIR TIMES TWO;  Surgeon: [name], [name], MD;  Location: [facility];  Service: General;  Laterality: N/A;   TOTAL HIP ARTHROPLASTY Left 03/22/2020   Procedure: LEFT TOTAL HIP ARTHROPLASTY ANTERIOR APPROACH;  Surgeon: Mcarthur Rossetti, MD;  Location: [facility];  Service: Orthopedics;  Laterality: Left;    CURRENT MEDICATIONS:   Current Outpatient Medications  Medication Sig Dispense Refill    allopurinol (ZYLOPRIM) 100 MG tablet Take 100 mg by mouth 2 (two) times daily.      amLODipine (NORVASC) 5 MG tablet Take 5 mg by mouth every morning.     warfarin (COUMADIN) 10 MG tablet Take 10 mg by mouth daily.     No current facility-administered medications for this visit.    ALLERGIES:   Allergies  Allergen Reactions   Iodine Hives    NOT CT  CONTRAST    FAMILY HISTORY:   Family History  Problem Relation Age of Onset   Colon cancer Mother    Stomach cancer Maternal Grandmother    Rectal cancer Neg Hx    Liver cancer Neg Hx    Pancreatic cancer Neg Hx     SOCIAL HISTORY:   reports that he quit smoking about 24 years ago. His smoking use included pipe. He quit smokeless tobacco use about 24 years ago.  His smokeless tobacco use included chew. He reports current alcohol use. He reports that he does not use drugs.  REVIEW OF SYSTEMS:  Review of Systems - Oncology   PHYSICAL EXAM:  There were no vitals taken for this visit. Wt Readings from Last 3 Encounters:  01/25/22 280 lb (127 kg)  01/01/22 282 lb (127.9 kg)  03/28/20 274 lb 14.6 oz (124.7 kg)   There is no height or weight on file to calculate BMI. Performance status (ECOG): {CHL ONC [id number] Physical Exam  LABS:   CBC Latest Ref Rng &  Units 01/25/2022 04/05/2020 04/04/2020  WBC 4.0 - 10.5 K/uL 7.7 9.4 9.7  Hemoglobin 13.0 - 17.0 g/dL 14.1 9.7(L) 9.7(L)  Hematocrit 39.0 - 52.0 % 43.9 31.3(L) 31.1(L)  Platelets 150 - 400 K/uL 145(L) 170 164   CMP Latest Ref Rng & Units 01/25/2022 04/05/2020 04/04/2020  Glucose 70 - 99 mg/dL 96 128(H) 125(H)  BUN 8 - 23 mg/dL '20 11 12  '$ Creatinine 0.61 - 1.24 mg/dL 0.91 1.02 1.06  Sodium 135 - 145 mmol/L 139 140 144  Potassium 3.5 - 5.1 mmol/L 4.2 3.1(L) 3.2(L)  Chloride 98 - 111 mmol/L 109 107 109  CO2 22 - 32 mmol/L '24 22 23  '$ Calcium 8.9 - 10.3 mg/dL 9.3 8.1(L) 8.1(L)  Total Protein 6.5 - 8.1 g/dL - - -  Total Bilirubin 0.3 - 1.2 mg/dL - - -  Alkaline Phos 38  - 126 U/L - - -  AST 15 - 41 U/L - - -  ALT 0 - 44 U/L - - -     No results found for: CEA1 / No results found for: CEA1 No results found for: PSA1 No results found for: OZD664 No results found for: CAN125  No results found for: TOTALPROTELP, ALBUMINELP, A1GS, A2GS, BETS, BETA2SER, GAMS, MSPIKE, SPEI Lab Results  Component Value Date   TIBC 217 10/15/2013   FERRITIN 586 (H) 10/15/2013   IRONPCTSAT 7 (L) 10/15/2013   No results found for: LDH  No results found for: AFPTUMOR, TOTALPROTELP, ALBUMINELP, A1GS, A2GS, BETS, BETA2SER, GAMS, MSPIKE, SPEI, LDH, CEA1, PSA1, IGASERUM, IGGSERUM, IGMSERUM, THGAB, THYROGLB  Recent Review Flowsheet Data     Oncology Labs Latest Ref Rng & Units 10/15/2013   FERRITIN 22 - 322 ng/mL 586(H)   IRONPCTSAT 20 - 55 % 7(L)       STUDIES:  DG Chest 2 View  Result Date: 01/25/2022 CLINICAL DATA:  Shortness of breath EXAM: CHEST - 2 VIEW COMPARISON:  None. FINDINGS: The heart size and mediastinal contours are within normal limits. Both lungs are clear. No pleural effusion or pneumothorax. The visualized skeletal structures are unremarkable. IMPRESSION: No active cardiopulmonary disease. Electronically Signed   By: Macy Mis M.D.   On: 01/25/2022 13:22   CT Angio Chest PE W and/or Wo Contrast  Result Date: 01/25/2022 CLINICAL DATA:  Left leg swelling.  History of multiple blood clots. EXAM: CT ANGIOGRAPHY CHEST WITH CONTRAST TECHNIQUE: Multidetector CT imaging of the chest was performed using the standard protocol during bolus administration of intravenous contrast. Multiplanar CT image reconstructions and MIPs were obtained to evaluate the vascular anatomy. RADIATION DOSE REDUCTION: This exam was performed according to the departmental dose-optimization program which includes automated exposure control, adjustment of the mA and/or kV according to patient size and/or use of iterative reconstruction technique. CONTRAST:  87m OMNIPAQUE IOHEXOL 350 MG/ML  SOLN COMPARISON:  10/15/2013 FINDINGS: Cardiovascular: The heart is normal in size. No pericardial effusion. The aorta is upper limits of normal in caliber. No dissection. Minimal atherosclerotic calcification at the aortic arch. No definite coronary artery calcifications. The pulmonary arterial tree is fairly well opacified. No filling defects to suggest pulmonary embolism. Mediastinum/Nodes: Small scattered mediastinal hilar lymph nodes but no mass or overt adenopathy. The esophagus is grossly normal. The thyroid gland is unremarkable. Lungs/Pleura: Streaky areas of subsegmental atelectasis but no focal infiltrates, edema or effusions. No worrisome pulmonary lesions or pulmonary nodules. Upper Abdomen: No significant upper abdominal findings. Musculoskeletal: No chest wall mass, supraclavicular or axillary adenopathy. The thyroid gland is unremarkable.  The bony thorax is intact. No worrisome bone lesions. Review of the MIP images confirms the above findings. IMPRESSION: 1. No CT findings for pulmonary embolism. 2. Normal thoracic aorta. 3. Streaky areas of subsegmental atelectasis but no focal infiltrates, edema or effusions. Electronically Signed   By: Marijo Sanes M.D.   On: 01/25/2022 15:22   VAS [place] LOWER EXTREMITY VENOUS (DVT)  Result Date: 01/10/2022  Lower Venous DVT Study Patient Name:  OLAF MESA  Date of Exam:   01/10/2022 Medical Rec #: [id number]      Accession #:    [id number] Date of Birth: 04-Apr-1956      Patient Gender: M Patient Age:   47 years Exam Location:  [facility] Procedure:      VAS [place] LOWER EXTREMITY VENOUS (DVT) Referring Phys: Erskine Emery --------------------------------------------------------------------------------  Indications: Swelling.  Risk Factors: HX of PE & DVT (09/2013). Anticoagulation: Coumadin. Comparison Study: Previous exam on 10/16/2013 was positive for DVT in RLE (PTV &                   PeroV) Performing Technologist: Rogelia Rohrer RVT, RDMS  Examination  Guidelines: A complete evaluation includes B-mode imaging, spectral Doppler, color Doppler, and power Doppler as needed of all accessible portions of each vessel. Bilateral testing is considered an integral part of a complete examination. Limited examinations for reoccurring indications may be performed as noted. The reflux portion of the exam is performed with the patient in reverse Trendelenburg.  +-----+---------------+---------+-----------+----------+--------------+  RIGHT Compressibility Phasicity Spontaneity Properties Thrombus Aging  +-----+---------------+---------+-----------+----------+--------------+  CFV   Full            Yes       Yes                                    +-----+---------------+---------+-----------+----------+--------------+  EIV   Full            Yes       Yes                                    +-----+---------------+---------+-----------+----------+--------------+   +---------+---------------+---------+-----------+----------+-------------------+  LEFT      Compressibility Phasicity Spontaneity Properties Thrombus Aging       +---------+---------------+---------+-----------+----------+-------------------+  CFV       Full            Yes       Yes                                         +---------+---------------+---------+-----------+----------+-------------------+  SFJ       Full                                                                  +---------+---------------+---------+-----------+----------+-------------------+  FV Prox   None            No        No  Acute                +---------+---------------+---------+-----------+----------+-------------------+  FV Mid    None            No        No                     Acute                +---------+---------------+---------+-----------+----------+-------------------+  FV Distal None            No        No                     Acute                 +---------+---------------+---------+-----------+----------+-------------------+  PFV       None            Yes       Yes                    Acute                +---------+---------------+---------+-----------+----------+-------------------+  POP       Partial         Yes       Yes                    Acute                +---------+---------------+---------+-----------+----------+-------------------+  PTV       Partial         No        No                     Acute- one of                                                                    paired veins         +---------+---------------+---------+-----------+----------+-------------------+  PERO      Full                                             Not well visualized  +---------+---------------+---------+-----------+----------+-------------------+     Summary: RIGHT: - No evidence of common femoral vein obstruction.  LEFT: - Findings consistent with acute deep vein thrombosis involving the left femoral vein, left proximal profunda vein, left popliteal vein, and left posterior tibial veins. - There is no evidence of superficial venous thrombosis.  - No cystic structure found in the popliteal fossa.  *See table(s) above for measurements and observations. Electronically signed by Harold Barban MD on 01/10/2022 at 10:31:20 PM.    Final      ASSESSMENT & PLAN:  A 66 y.o. male who I was asked to consult upon for *** .The patient understands all the plans discussed today and is in agreement with them.  I do appreciate [place], Marshall Cork., MD for his new consult.   Shirelle Tootle Macarthur Critchley, MD

## 2022-01-29 ENCOUNTER — Other Ambulatory Visit: Payer: Self-pay

## 2022-01-29 ENCOUNTER — Other Ambulatory Visit: Payer: Self-pay | Admitting: Oncology

## 2022-01-29 ENCOUNTER — Encounter: Payer: Self-pay | Admitting: Oncology

## 2022-01-29 ENCOUNTER — Inpatient Hospital Stay: Payer: Medicare HMO

## 2022-01-29 ENCOUNTER — Inpatient Hospital Stay: Payer: Medicare HMO | Attending: Oncology | Admitting: Oncology

## 2022-01-29 DIAGNOSIS — I82412 Acute embolism and thrombosis of left femoral vein: Secondary | ICD-10-CM | POA: Diagnosis present

## 2022-01-29 DIAGNOSIS — Z7901 Long term (current) use of anticoagulants: Secondary | ICD-10-CM | POA: Insufficient documentation

## 2022-01-29 DIAGNOSIS — I10 Essential (primary) hypertension: Secondary | ICD-10-CM | POA: Insufficient documentation

## 2022-01-29 DIAGNOSIS — G4730 Sleep apnea, unspecified: Secondary | ICD-10-CM | POA: Insufficient documentation

## 2022-01-29 DIAGNOSIS — Z87891 Personal history of nicotine dependence: Secondary | ICD-10-CM | POA: Insufficient documentation

## 2022-01-29 DIAGNOSIS — Z86711 Personal history of pulmonary embolism: Secondary | ICD-10-CM | POA: Diagnosis not present

## 2022-01-29 DIAGNOSIS — D6861 Antiphospholipid syndrome: Secondary | ICD-10-CM | POA: Diagnosis not present

## 2022-01-29 DIAGNOSIS — M109 Gout, unspecified: Secondary | ICD-10-CM | POA: Insufficient documentation

## 2022-01-29 DIAGNOSIS — Z79899 Other long term (current) drug therapy: Secondary | ICD-10-CM | POA: Insufficient documentation

## 2022-01-30 LAB — LUPUS ANTICOAGULANT
DRVVT: 89.9 s — ABNORMAL HIGH (ref 0.0–47.0)
PTT Lupus Anticoagulant: 41.4 s (ref 0.0–43.5)
Thrombin Time: 18.6 s (ref 0.0–23.0)
dPT Confirm Ratio: 0.92 Ratio (ref 0.00–1.34)
dPT: 52.7 s — ABNORMAL HIGH (ref 0.0–47.6)

## 2022-01-30 LAB — DRVVT CONFIRM: dRVVT Confirm: 1.8 ratio — ABNORMAL HIGH (ref 0.8–1.2)

## 2022-01-30 LAB — DRVVT MIX: dRVVT Mix: 63.8 s — ABNORMAL HIGH (ref 0.0–40.4)

## 2022-01-31 LAB — CARDIOLIPIN ANTIBODIES, IGG, IGM, IGA
Anticardiolipin IgA: <9 APL U/mL (ref 0–11)
Anticardiolipin IgG: <9 GPL U/mL (ref 0–14)
Anticardiolipin IgM: <9 MPL U/mL (ref 0–12)

## 2022-01-31 LAB — BETA-2-GLYCOPROTEIN I ABS, IGG/M/A
Beta-2 Glyco I IgG: <9 GPI IgG units (ref 0–20)
Beta-2-Glycoprotein I IgA: <9 GPI IgA units (ref 0–25)
Beta-2-Glycoprotein I IgM: <9 GPI IgM units (ref 0–32)

## 2022-02-05 DIAGNOSIS — I82412 Acute embolism and thrombosis of left femoral vein: Secondary | ICD-10-CM | POA: Insufficient documentation

## 2022-02-18 NOTE — Progress Notes (Signed)
?[facility]  ?[address] ?[place],  [place]  [address] ?[phone] ? ?Clinic Day:  02/19/2022 ? ?Referring physician: [name]., MD ? ? ?HISTORY OF PRESENT ILLNESS:  ?The patient is a 66 y.o. male  who I recently began seeing for a DVT in his left lower extremity.  Of note, this gentleman also has a history of a portal vein thrombosis and pulmonary emboli.  He comes in today to go over his recent labs to determine if antiphospholipid syndrome is present.  Of note, the patient did undergo a hypercoagulable work-up in 2013, which showed that he did not have any the following disorders: Protein C deficiency, protein S deficiency, factor V Leiden mutation, prothrombin gene mutation, antithrombin deficiency, and antiphospholipid syndrome.  However, his antiphospholipid syndrome labs were rechecked recently because they do have the potential to change over time; the other clotting disorders are hereditary in nature and remain the same throughout one's lifetime.  Since his last visit, the patient has been doing well.  He continues to take Xarelto on a daily basis and denies having any complications from it.  He also denies having any leg swelling, pleuritic chest wall pain, or other symptoms which concern him for recurrent blood clots having developed. ? ?PHYSICAL EXAM:  ?Blood pressure 117/71, pulse 64, temperature 98.5 ?F (36.9 ?C), resp. rate 18, height '6\' 1"'$  (1.854 m), weight 284 lb 3.2 oz (128.9 kg), SpO2 97 %. ?Wt Readings from Last 3 Encounters:  ?02/19/22 284 lb 3.2 oz (128.9 kg)  ?01/29/22 281 lb 4.8 oz (127.6 kg)  ?01/25/22 280 lb (127 kg)  ? ?Body mass index is 37.5 kg/m?Marland Kitchen ?Performance status (ECOG): 0 - Asymptomatic ?Physical Exam ?Constitutional:   ?   Appearance: Normal appearance. He is not ill-appearing.  ?HENT:  ?   Mouth/Throat:  ?   Mouth: Mucous membranes are moist.  ?   Pharynx: Oropharynx is clear. No oropharyngeal exudate or posterior oropharyngeal  erythema.  ?Cardiovascular:  ?   Rate and Rhythm: Normal rate and regular rhythm.  ?   Heart sounds: No murmur heard. ?  No friction rub. No gallop.  ?Pulmonary:  ?   Effort: Pulmonary effort is normal. No respiratory distress.  ?   Breath sounds: Normal breath sounds. No wheezing, rhonchi or rales.  ?Abdominal:  ?   General: Bowel sounds are normal. There is no distension.  ?   Palpations: Abdomen is soft. There is no mass.  ?   Tenderness: There is no abdominal tenderness.  ?Musculoskeletal:     ?   General: No swelling.  ?   Right lower leg: No edema.  ?   Left lower leg: No edema.  ?Lymphadenopathy:  ?   Cervical: No cervical adenopathy.  ?   Upper Body:  ?   Right upper body: No supraclavicular or axillary adenopathy.  ?   Left upper body: No supraclavicular or axillary adenopathy.  ?   Lower Body: No right inguinal adenopathy. No left inguinal adenopathy.  ?Skin: ?   General: Skin is warm.  ?   Coloration: Skin is not jaundiced.  ?   Findings: No lesion or rash.  ?Neurological:  ?   General: No focal deficit present.  ?   Mental Status: He is alert and oriented to person, place, and time. Mental status is at baseline.  ?Psychiatric:     ?   Mood and Affect: Mood normal.     ?   Behavior:  Behavior normal.     ?   Thought Content: Thought content normal.  ? ?LABS:  ? ? Latest Reference Range & Units 01/29/22 11:48  ?Anticardiolipin Ab,IgA,Qn 0 - 11 APL U/mL <9  ?Anticardiolipin Ab,IgG,Qn 0 - 14 GPL U/mL <9  ?Anticardiolipin Ab,IgM,Qn 0 - 12 MPL U/mL <9  ?PTT Lupus Anticoagulant 0.0 - 43.5 sec 41.4  ?DRVVT 0.0 - 47.0 sec 89.9 (H)  ?Lupus Anticoag Interp  Comment:Results are consistent with the presence of a lupus anticoagulant.  Important Note: The results of LA testing are not valid for patients receiving heparin, direct Xa inhibitor (e.g., rivaroxaban, apixaban)  ?or direct thrombin inhibitor (e.g., dabigatran) therapy  ?Beta-2 Glycoprotein I Ab, IgG 0 - 20 GPI  <9  ?Beta-2-Glycoprotein I IgA 0 - 25 GPI IgA  units <9  ?Beta-2-Glycoprotein I IgM 0 - 32 GPI IgM units <9  ?(H): Data is abnormally high ? ?STUDIES:  ?DG Chest 2 View ? ?Result Date: 01/25/2022 ?CLINICAL DATA:  Shortness of breath EXAM: CHEST - 2 VIEW COMPARISON:  None. FINDINGS: The heart size and mediastinal contours are within normal limits. Both lungs are clear. No pleural effusion or pneumothorax. The visualized skeletal structures are unremarkable. IMPRESSION: No active cardiopulmonary disease. Electronically Signed   By: Macy Mis M.D.   On: 01/25/2022 13:22  ? ?CT Angio Chest PE W and/or Wo Contrast ? ?Result Date: 01/25/2022 ?CLINICAL DATA:  Left leg swelling.  History of multiple blood clots. EXAM: CT ANGIOGRAPHY CHEST WITH CONTRAST TECHNIQUE: Multidetector CT imaging of the chest was performed using the standard protocol during bolus administration of intravenous contrast. Multiplanar CT image reconstructions and MIPs were obtained to evaluate the vascular anatomy. RADIATION DOSE REDUCTION: This exam was performed according to the departmental dose-optimization program which includes automated exposure control, adjustment of the mA and/or kV according to patient size and/or use of iterative reconstruction technique. CONTRAST:  5m OMNIPAQUE IOHEXOL 350 MG/ML SOLN COMPARISON:  10/15/2013 FINDINGS: Cardiovascular: The heart is normal in size. No pericardial effusion. The aorta is upper limits of normal in caliber. No dissection. Minimal atherosclerotic calcification at the aortic arch. No definite coronary artery calcifications. The pulmonary arterial tree is fairly well opacified. No filling defects to suggest pulmonary embolism. Mediastinum/Nodes: Small scattered mediastinal hilar lymph nodes but no mass or overt adenopathy. The esophagus is grossly normal. The thyroid gland is unremarkable. Lungs/Pleura: Streaky areas of subsegmental atelectasis but no focal infiltrates, edema or effusions. No worrisome pulmonary lesions or pulmonary nodules.  Upper Abdomen: No significant upper abdominal findings. Musculoskeletal: No chest wall mass, supraclavicular or axillary adenopathy. The thyroid gland is unremarkable. The bony thorax is intact. No worrisome bone lesions. Review of the MIP images confirms the above findings. IMPRESSION: 1. No CT findings for pulmonary embolism. 2. Normal thoracic aorta. 3. Streaky areas of subsegmental atelectasis but no focal infiltrates, edema or effusions. Electronically Signed   By: PMarijo SanesM.D.   On: 01/25/2022 15:22    ? ?ASSESSMENT & PLAN:  ?A 66y.o. male with a recent history of a left femoral vein DVT, which extended downwards into his left posterior tibial veins.  He also has a remote history of a portal vein thrombus and pulmonary emboli.  In clinic today, I went over his antiphospholipid syndrome labs which once again showed the absence of any beta-2 glycoprotein or cardiolipin antibodies.  Although his lupus anticoagulant screen was positive, this was in the setting of him taking Xarelto.  When the same studies were checked  10 years ago, all 3 studies for antiphospholipid syndrome came back negative.  I therefore believe his lupus anticoagulant screen is a false positive related to his current Xarelto use.  Once again there appears to be no obvious clotting disorder which led to him developing the DVT in his left leg.  Nevertheless, he understands that as he has had multiple blood clots in his lifetime, indefinite anticoagulation is recommended.  The patient reiterated that he was compliant with his Coumadin when his most recent DVT developed.  He also states his INR was therapeutic at that time his DVT developed while on Coumadin.  Based upon these findings, I do believe he should be considered a Coumadin failure.  Moving forward, continuing him on Xarelto indefinitely appears the right decision for his chronic anticoagulation management moving forward.  As he has no other pressing hematologic issues, I do feel  comfortable turning his care back over to his primary care office.  I would not have a problem seeing him in the future if new hematologic or oncologic issues arise that require repeat clinical assessment.  The p

## 2022-02-19 ENCOUNTER — Inpatient Hospital Stay: Payer: Medicare HMO | Attending: Oncology | Admitting: Oncology

## 2022-02-19 VITALS — BP 117/71 | HR 64 | Temp 98.5°F | Resp 18 | Ht 73.0 in | Wt 284.2 lb

## 2022-02-19 DIAGNOSIS — I82412 Acute embolism and thrombosis of left femoral vein: Secondary | ICD-10-CM | POA: Diagnosis not present

## 2023-09-27 ENCOUNTER — Ambulatory Visit: Payer: Medicare HMO | Admitting: Gastroenterology

## 2023-09-27 ENCOUNTER — Encounter: Payer: Self-pay | Admitting: Gastroenterology

## 2023-09-27 VITALS — BP 130/78 | HR 86 | Ht 73.0 in | Wt 279.0 lb

## 2023-09-27 DIAGNOSIS — Z800 Family history of malignant neoplasm of digestive organs: Secondary | ICD-10-CM | POA: Diagnosis not present

## 2023-09-27 DIAGNOSIS — Z860100 Personal history of colon polyps, unspecified: Secondary | ICD-10-CM | POA: Insufficient documentation

## 2023-09-27 DIAGNOSIS — Z7901 Long term (current) use of anticoagulants: Secondary | ICD-10-CM | POA: Diagnosis not present

## 2023-09-27 MED ORDER — NA SULFATE-K SULFATE-MG SULF 17.5-3.13-1.6 GM/177ML PO SOLN: 1.0000 | Freq: Once | ORAL | 0 refills | Status: AC

## 2023-09-27 NOTE — Progress Notes (Signed)
**Note Dwayne-Identified via Obfuscation** 09/27/2023 Dwayne Lawrence [id number] 30-Jun-1956   HISTORY OF PRESENT ILLNESS:  This is a 67 year old male who is a patient of Dr. Urban Lawrence.  He is here today to talk about and schedule colonoscopy.  He has past medical history of DVT/PE/hypercoagulable state previously on Coumadin, but now on Xarelto lifelong.  Also has gout, hypertension, OSA, history of prostate cancer status post radical prostatectomy in 2014, appendicectomy, partial small bowel obstruction due to adhesions status post lysis of adhesions on 03/2020.  Xarelto has been prescribed by his PCP, Dr. Egbert Lawrence.  He has personal history of colon polyps as below an family history of colon cancer in his mother diagnosed at age 67.  He denies any GI complaints at this time.  He says that he uses some stool softeners and that keeps his bowels regular.  Past GI procedures Colonoscopy 06/03/2017 ([facility], with difficulty d/t redundant colon): Small internal hemorrhoids, otherwise normal colonoscopy.  Repeat in 3 years d/t difficulty in examination and family history.  Coumadin was held 5 days prior.  No Lovenox bridging.   Colonoscopy 05/2014: Colon polyps s/p polypectomy, internal hemorrhoids. Bx- Tas   Colonoscopy 01/2009 Dr. Consuelo Lawrence to visualize cecum.  ACBE negative thereafter.   Past Medical History:  Diagnosis Date   Arthritis    DVT (deep venous thrombosis) (HCC)    right PT and peroneal veins DVT 10/16/13    Gout no recent flares   Hypertension    Prostate cancer Dwayne Lawrence)    Pulmonary embolus (HCC)    right lung 2009; bilateral PE 10/15/13 post-prostatectomy   Sleep apnea    does not use cpap, last sleep study more than 3 yrs ago   Past Surgical History:  Procedure Laterality Date   APPENDECTOMY     LAPAROSCOPIC LYSIS OF ADHESIONS N/A 03/31/2020   Procedure: LAPAROSCOPIC LYSIS OF ADHESIONS;  Surgeon: Dwayne Hatch Dwayne Blanch, MD;  Location: [facility] OR;  Service: General;  Laterality: N/A;   LYMPHADENECTOMY Bilateral  10/07/2013   Procedure: LYMPHADENECTOMY " BILATERAL PELVIC LYMPH NODE DISSECTION";  Surgeon: Dwayne Fuller, MD;  Location: [facility];  Service: Urology;  Laterality: Bilateral;   POLYPECTOMY     PROSTATE BIOPSY  2013 and oct 2014   PROSTATECTOMY     ROBOT ASSISTED LAPAROSCOPIC RADICAL PROSTATECTOMY N/A 10/07/2013   Procedure: ROBOTIC ASSISTED LAPAROSCOPIC RADICAL PROSTATECTOMY;  Surgeon: Dwayne Fuller, MD;  Location: [facility];  Service: Urology;  Laterality: N/A;   SMALL BOWEL REPAIR N/A 03/31/2020   Procedure: LAPAROSCOPIC SMALL BOWEL REPAIR TIMES TWO;  Surgeon: Dwayne Lawrence, Dwayne Blanch, MD;  Location: [facility] OR;  Service: General;  Laterality: N/A;   TOTAL HIP ARTHROPLASTY Left 03/22/2020   Procedure: LEFT TOTAL HIP ARTHROPLASTY ANTERIOR APPROACH;  Surgeon: Dwayne Hitch, MD;  Location: [facility] OR;  Service: Orthopedics;  Laterality: Left;   WISDOM TOOTH EXTRACTION      reports that he quit smoking about 25 years ago. His smoking use included pipe. He quit smokeless tobacco use about 25 years ago.  His smokeless tobacco use included chew. He reports current alcohol use. He reports that he does not use drugs. family history includes Colon cancer in his mother; Stomach cancer in his maternal grandmother. Allergies  Allergen Reactions   Iodine Hives    NOT CT  CONTRAST      Outpatient Encounter Medications as of 09/27/2023  Medication Sig   allopurinol (ZYLOPRIM) 100 MG tablet Take 100 mg by mouth 2 (two) times daily.    amLODipine (  NORVASC) 5 MG tablet Take 5 mg by mouth every morning.   XARELTO 20 MG TABS tablet Take 20 mg by mouth daily.   No facility-administered encounter medications on file as of 09/27/2023.    REVIEW OF SYSTEMS  : All other systems reviewed and negative except where noted in the History of Present Illness.   PHYSICAL EXAM: BP 130/78   Pulse 86   Ht 6\' 1"  (1.854 m)   Wt 279 lb (126.6 kg)   BMI 36.81 kg/m  General: Well developed white male in no acute  distress Head: Normocephalic and atraumatic Eyes:  Sclerae anicteric, conjunctiva pink. Ears: Normal auditory acuity Lungs: Clear throughout to auscultation; no W/R/R. Heart: Regular rate and rhythm; no M/R/G. Rectal:  Will be performed at the time of colonoscopy. Musculoskeletal: Symmetrical with no gross deformities  Skin: No lesions on visible extremities Neurological: Alert oriented x 4, grossly non-focal Psychological:  Alert and cooperative. Normal mood and affect  ASSESSMENT AND PLAN: *Personal history of colon polyps and family history of colon cancer in mother diagnosed at age 57.  Will schedule for colonoscopy with Dr. Chales Abrahams. *Chronic anticoagulation:  Will hold Xarelto for 2 days prior to endoscopic procedures - will instruct when and how to resume after procedure. Benefits and risks of procedure explained including risks of bleeding, perforation, infection, missed lesions, reactions to medications and possible need for hospitalization and surgery for complications. Additional rare but real risk of stroke or other vascular clotting events off Xarelto also explained and need to seek urgent help if any signs of these problems occur. Will communicate by phone or EMR with patient's prescribing provider, Dr. Egbert Lawrence, to confirm that holding Xarelto is reasonable in this case.     CC:  Dwayne Lawrence., MD

## 2023-09-27 NOTE — Patient Instructions (Signed)
You have been scheduled for a colonoscopy. Please follow written instructions given to you at your visit today.   Please pick up your prep supplies at the pharmacy within the next 1-3 days.  If you use inhalers (even only as needed), please bring them with you on the day of your procedure.  DO NOT TAKE 7 DAYS PRIOR TO TEST- Trulicity (dulaglutide) Ozempic, Wegovy (semaglutide) Mounjaro (tirzepatide) Bydureon Bcise (exanatide extended release)  DO NOT TAKE 1 DAY PRIOR TO YOUR TEST Rybelsus (semaglutide) Adlyxin (lixisenatide) Victoza (liraglutide) Byetta (exanatide) ____________________________________________________________________  _______________________________________________________  If your blood pressure at your visit was 140/90 or greater, please contact your primary care physician to follow up on this.  _______________________________________________________  If you are age 30 or older, your body mass index should be between 23-30. Your Body mass index is 36.81 kg/m. If this is out of the aforementioned range listed, please consider follow up with your Primary Care Provider.  If you are age 33 or younger, your body mass index should be between 19-25. Your Body mass index is 36.81 kg/m. If this is out of the aformentioned range listed, please consider follow up with your Primary Care Provider.   ________________________________________________________  The [facility] providers would like to encourage you to use [employer] to communicate with providers for non-urgent requests or questions.  Due to long hold times on the telephone, sending your provider a message by [facility] may be a faster and more efficient way to get a response.  Please allow 48 business hours for a response.  Please remember that this is for non-urgent requests.  _______________________________________________________

## 2023-10-05 NOTE — Progress Notes (Signed)
Agree with assessment/plan.  Raj Florestine Carmical, MD [facility] [phone]  

## 2023-10-28 ENCOUNTER — Ambulatory Visit: Payer: Medicare HMO | Admitting: Physician Assistant

## 2023-10-28 ENCOUNTER — Other Ambulatory Visit: Payer: Self-pay

## 2023-10-28 ENCOUNTER — Encounter: Payer: Self-pay | Admitting: Physician Assistant

## 2023-10-28 DIAGNOSIS — M25551 Pain in right hip: Secondary | ICD-10-CM | POA: Diagnosis not present

## 2023-10-28 DIAGNOSIS — M1611 Unilateral primary osteoarthritis, right hip: Secondary | ICD-10-CM

## 2023-10-28 NOTE — Progress Notes (Signed)
HPI: [name] comes in today due to right hip pain it has been ongoing for the last few months.  No injury.  He states he has pain when walking ranks pain 7 out of 10 pain describes it as sharp pain points to the lateral aspect of the hip and buttocks region no particular groin pain.  He has tried Tylenol and Voltaren gel which seems to help.  He has no pain when lying on the hip.  He has no real radicular symptoms down the leg.  History of left total hip arthroplasty 03/22/2020 doing well.  He is on Xarelto secondary with history of acute DVT left lower extremity.  Review of systems: See HPI otherwise negative.  Physical exam: General Well-developed well-nourished male no acute distress mood affect appropriate.  He walks with slight antalgic gait but is able to get on and off the exam table on his own. Respirations: Unlabored Psych: Alert and oriented x 3 Bilateral hips: Left hip overall good range of motion with internal/external rotation.  Limited external rotation right hip internal rotation of the right hip causes pain and is extremely limited.  Nontender over the trochanteric region bilateral hips.  Radiographs:AP pelvis lateral view right hip: No acute fracture.  Both hips well located.  Left total hip arthroplasty components appear well-seated.  Right hip near bone-on-bone narrowing of the hip joint.  Periarticular spurring inferior and superior aspects of the femoral head.  Cam type impingement.  Impression: Right hip osteoarthritis   Plan: Discussed with patient radiographic findings.  Discussed conservative treatment versus surgical intervention he would like to proceed with conservative treatment for now.  He does have an upcoming colonoscopy in February.  He will return after February to see what type of relief he had with the conservative treatment.  Conservative treatment with his discussed and he agreed to use a right hip intra-articular injection.  Will schedule this with Dr. Shon Baton  in the near future.  Questions were encouraged and answered of the patient and his wife who is present today.

## 2023-10-29 ENCOUNTER — Other Ambulatory Visit: Payer: Self-pay

## 2023-10-29 DIAGNOSIS — M25551 Pain in right hip: Secondary | ICD-10-CM

## 2023-11-06 ENCOUNTER — Ambulatory Visit: Payer: Medicare HMO | Admitting: Sports Medicine

## 2023-12-11 ENCOUNTER — Telehealth: Payer: Self-pay | Admitting: *Deleted

## 2023-12-11 NOTE — Telephone Encounter (Signed)
-----   Message from [facility] sent at 09/27/2023 10:35 AM EST ----- Regarding: clearance needed Xarelto 2 day hold - [place]   Scheduled with Dr. Chales Abrahams 01/01/24

## 2023-12-11 NOTE — Telephone Encounter (Signed)
Received clearance approval for patient to hold Xarelto for 2 days. Clearance approval sent to scan center.

## 2023-12-25 ENCOUNTER — Ambulatory Visit (AMBULATORY_SURGERY_CENTER): Payer: Medicare HMO

## 2023-12-25 ENCOUNTER — Encounter: Payer: Self-pay | Admitting: Gastroenterology

## 2023-12-25 VITALS — Ht 73.0 in | Wt 279.0 lb

## 2023-12-25 DIAGNOSIS — Z800 Family history of malignant neoplasm of digestive organs: Secondary | ICD-10-CM

## 2023-12-25 DIAGNOSIS — Z860100 Personal history of colon polyps, unspecified: Secondary | ICD-10-CM

## 2023-12-25 NOTE — Progress Notes (Signed)

## 2023-12-27 NOTE — Telephone Encounter (Signed)
 Patient informed cleared to hold Xarelto.

## 2024-01-01 ENCOUNTER — Encounter: Payer: Self-pay | Admitting: Gastroenterology

## 2024-01-01 ENCOUNTER — Ambulatory Visit: Payer: Medicare HMO | Admitting: Gastroenterology

## 2024-01-01 VITALS — BP 144/84 | HR 57 | Temp 97.3°F | Resp 11 | Ht 73.0 in | Wt 279.0 lb

## 2024-01-01 DIAGNOSIS — Z860100 Personal history of colon polyps, unspecified: Secondary | ICD-10-CM

## 2024-01-01 DIAGNOSIS — K635 Polyp of colon: Secondary | ICD-10-CM | POA: Diagnosis not present

## 2024-01-01 DIAGNOSIS — Z800 Family history of malignant neoplasm of digestive organs: Secondary | ICD-10-CM

## 2024-01-01 DIAGNOSIS — K640 First degree hemorrhoids: Secondary | ICD-10-CM

## 2024-01-01 DIAGNOSIS — Z1211 Encounter for screening for malignant neoplasm of colon: Secondary | ICD-10-CM | POA: Diagnosis present

## 2024-01-01 DIAGNOSIS — D122 Benign neoplasm of ascending colon: Secondary | ICD-10-CM

## 2024-01-01 MED ORDER — SODIUM CHLORIDE 0.9 % IV SOLN: 500.0000 mL | INTRAVENOUS | Status: DC

## 2024-01-01 NOTE — Progress Notes (Unsigned)
Pt sedate, gd SR's, VSS, report to RN

## 2024-01-01 NOTE — Progress Notes (Unsigned)
Pt's states no medical or surgical changes since previsit or office visit.

## 2024-01-01 NOTE — Progress Notes (Unsigned)
09/27/2023 Dwayne Lawrence [id number] 08/14/1956     HISTORY OF PRESENT ILLNESS:  This is a 68 year old male who is a patient of Dr. Urban Gibson.  He is here today to talk about and schedule colonoscopy.  He has past medical history of DVT/PE/hypercoagulable state previously on Coumadin, but now on Xarelto lifelong.  Also has gout, hypertension, OSA, history of prostate cancer status post radical prostatectomy in 2014, appendicectomy, partial small bowel obstruction due to adhesions status post lysis of adhesions on 03/2020.  Xarelto has been prescribed by his PCP, Dr. Egbert Garibaldi.  He has personal history of colon polyps as below an family history of colon cancer in his mother diagnosed at age 44.  He denies any GI complaints at this time.  He says that he uses some stool softeners and that keeps his bowels regular.   Past GI procedures Colonoscopy 06/03/2017 ([facility], with difficulty d/t redundant colon): Small internal hemorrhoids, otherwise normal colonoscopy.  Repeat in 3 years d/t difficulty in examination and family history.  Coumadin was held 5 days prior.  No Lovenox bridging.   Colonoscopy 05/2014: Colon polyps s/p polypectomy, internal hemorrhoids. Bx- Tas   Colonoscopy 01/2009 Dr. Consuelo Pandy to visualize cecum.  ACBE negative thereafter.         Past Medical History:  Diagnosis Date   Arthritis     DVT (deep venous thrombosis) (HCC)      right PT and peroneal veins DVT 10/16/13    Gout no recent flares   Hypertension     Prostate cancer [facility])     Pulmonary embolus (HCC)      right lung 2009; bilateral PE 10/15/13 post-prostatectomy   Sleep apnea      does not use cpap, last sleep study more than 3 yrs ago             Past Surgical History:  Procedure Laterality Date   APPENDECTOMY       LAPAROSCOPIC LYSIS OF ADHESIONS N/A 03/31/2020    Procedure: LAPAROSCOPIC LYSIS OF ADHESIONS;  Surgeon: Sheliah Hatch De Blanch, MD;  Location: [facility] OR;  Service: General;  Laterality: N/A;    LYMPHADENECTOMY Bilateral 10/07/2013    Procedure: LYMPHADENECTOMY " BILATERAL PELVIC LYMPH NODE DISSECTION";  Surgeon: Valetta Fuller, MD;  Location: [facility];  Service: Urology;  Laterality: Bilateral;   POLYPECTOMY       PROSTATE BIOPSY   2013 and oct 2014   PROSTATECTOMY       ROBOT ASSISTED LAPAROSCOPIC RADICAL PROSTATECTOMY N/A 10/07/2013    Procedure: ROBOTIC ASSISTED LAPAROSCOPIC RADICAL PROSTATECTOMY;  Surgeon: Valetta Fuller, MD;  Location: [facility];  Service: Urology;  Laterality: N/A;   SMALL BOWEL REPAIR N/A 03/31/2020    Procedure: LAPAROSCOPIC SMALL BOWEL REPAIR TIMES TWO;  Surgeon: [name], De Blanch, MD;  Location: [facility] OR;  Service: General;  Laterality: N/A;   TOTAL HIP ARTHROPLASTY Left 03/22/2020    Procedure: LEFT TOTAL HIP ARTHROPLASTY ANTERIOR APPROACH;  Surgeon: Kathryne Hitch, MD;  Location: [facility] OR;  Service: Orthopedics;  Laterality: Left;   WISDOM TOOTH EXTRACTION             reports that he quit smoking about 25 years ago. His smoking use included pipe. He quit smokeless tobacco use about 25 years ago.  His smokeless tobacco use included chew. He reports current alcohol use. He reports that he does not use drugs. family history includes Colon cancer in his mother; Stomach cancer in his maternal grandmother. Allergies  Allergies  Allergen Reactions   Iodine Hives      NOT CT  CONTRAST              Outpatient Encounter Medications as of 09/27/2023  Medication Sig   allopurinol (ZYLOPRIM) 100 MG tablet Take 100 mg by mouth 2 (two) times daily.    amLODipine (NORVASC) 5 MG tablet Take 5 mg by mouth every morning.   XARELTO 20 MG TABS tablet Take 20 mg by mouth daily.      No facility-administered encounter medications on file as of 09/27/2023.        REVIEW OF SYSTEMS  : All other systems reviewed and negative except where noted in the History of Present Illness.     PHYSICAL EXAM: BP 130/78   Pulse 86   Ht 6\' 1"  (1.854 m)   Wt 279 lb  (126.6 kg)   BMI 36.81 kg/m  General: Well developed white male in no acute distress Head: Normocephalic and atraumatic Eyes:  Sclerae anicteric, conjunctiva pink. Ears: Normal auditory acuity Lungs: Clear throughout to auscultation; no W/R/R. Heart: Regular rate and rhythm; no M/R/G. Rectal:  Will be performed at the time of colonoscopy. Musculoskeletal: Symmetrical with no gross deformities  Skin: No lesions on visible extremities Neurological: Alert oriented x 4, grossly non-focal Psychological:  Alert and cooperative. Normal mood and affect   ASSESSMENT AND PLAN: *Personal history of colon polyps and family history of colon cancer in mother diagnosed at age 54.  Will schedule for colonoscopy with Dr. Chales Abrahams. *Chronic anticoagulation:  Will hold Xarelto for 2 days prior to endoscopic procedures - will instruct when and how to resume after procedure. Benefits and risks of procedure explained including risks of bleeding, perforation, infection, missed lesions, reactions to medications and possible need for hospitalization and surgery for complications. Additional rare but real risk of stroke or other vascular clotting events off Xarelto also explained and need to seek urgent help if any signs of these problems occur. Will communicate by phone or EMR with patient's prescribing provider, Dr. Egbert Garibaldi, to confirm that holding Xarelto is reasonable in this case.          Attending physician's note   I have taken history, reviewed the chart and examined the patient. I performed a substantive portion of this encounter, including complete performance of at least one of the key components, in conjunction with the APP. I agree with the Advanced Practitioner's note, impression and recommendations.   Colon Xeralto on hold   Edman Circle, MD [facility] [phone]

## 2024-01-01 NOTE — Op Note (Signed)
[facility] Patient Name: Dwayne Lawrence Procedure Date: 01/01/2024 10:45 AM MRN: [id number] Endoscopist: Lynann Bologna , MD, [id number] Age: 68 Referring MD:  Date of Birth: 1956-08-14 Gender: Male Account #: [id number] Procedure:                Colonoscopy Indications:              High risk colon cancer surveillance: Personal                            history of colonic polyps. FH CRC (mom at age 49) Medicines:                Monitored Anesthesia Care Procedure:                Pre-Anesthesia Assessment:                           - Prior to the procedure, a History and Physical                            was performed, and patient medications and                            allergies were reviewed. The patient's tolerance of                            previous anesthesia was also reviewed. The risks                            and benefits of the procedure and the sedation                            options and risks were discussed with the patient.                            All questions were answered, and informed consent                            was obtained. Prior Anticoagulants: Xerato held 2                            days ASA Grade Assessment: III - A patient with                            severe systemic disease. After reviewing the risks                            and benefits, the patient was deemed in                            satisfactory condition to undergo the procedure.                           After obtaining informed consent, the colonoscope  was passed under direct vision. Throughout the                            procedure, the patient's blood pressure, pulse, and                            oxygen saturations were monitored continuously. The                            Olympus CF-HQ190L ([id number]) Colonoscope was                            introduced through the anus and advanced to the the                            cecum,  identified by appendiceal orifice and                            ileocecal valve. The colonoscopy was performed with                            some difficulty d/t redundant colon and pt's body                            habitus. The patient tolerated the procedure well.                            The quality of the bowel preparation was good. The                            ileocecal valve, appendiceal orifice, and rectum                            were photographed. Scope In: 10:52:33 AM Scope Out: 11:17:18 AM Total Procedure Duration: 0 hours 24 minutes 45 seconds  Findings:                 A 6 mm polyp was found in the proximal ascending                            colon. The polyp was sessile. The polyp was removed                            with a cold snare. Resection and retrieval were                            complete.                           Non-bleeding internal hemorrhoids were found during                            retroflexion. The hemorrhoids were small and Grade  I (internal hemorrhoids that do not prolapse).                           The exam was otherwise without abnormality on                            direct and retroflexion views. Complications:            No immediate complications. Estimated Blood Loss:     Estimated blood loss: none. Impression:               - One 6 mm polyp in the proximal ascending colon,                            removed with a cold snare. Resected and retrieved.                           - Non-bleeding internal hemorrhoids.                           - The examination was otherwise normal on direct                            and retroflexion views. Recommendation:           - Patient has a contact number available for                            emergencies. The signs and symptoms of potential                            delayed complications were discussed with the                            patient. Return to  normal activities tomorrow.                            Written discharge instructions were provided to the                            patient.                           - Resume previous diet.                           - Continue present medications.                           - Await pathology results.                           - Repeat colonoscopy for surveillance based on                            pathology results.                           -  Resume Xarelto (rivaroxaban) at prior dose                            tomorrow.                           - The findings and recommendations were discussed                            with the patient's family. Lynann Bologna, MD 01/01/2024 11:22:44 AM This report has been signed electronically.

## 2024-01-01 NOTE — Patient Instructions (Signed)
Thank you for letting us take care of your healthcare needs today. Please see handouts given to you on Polyps and Hemorrhoids. You may resume your Xarelto at  prior dose tomorrow.    YOU HAD AN ENDOSCOPIC PROCEDURE TODAY AT THE [facility]:   Refer to the procedure report that was given to you for any specific questions about what was found during the examination.  If the procedure report does not answer your questions, please call your gastroenterologist to clarify.  If you requested that your care partner not be given the details of your procedure findings, then the procedure report has been included in a sealed envelope for you to review at your convenience later.  YOU SHOULD EXPECT: Some feelings of bloating in the abdomen. Passage of more gas than usual.  Walking can help get rid of the air that was put into your GI tract during the procedure and reduce the bloating. If you had a lower endoscopy (such as a colonoscopy or flexible sigmoidoscopy) you may notice spotting of blood in your stool or on the toilet paper. If you underwent a bowel prep for your procedure, you may not have a normal bowel movement for a few days.  Please Note:  You might notice some irritation and congestion in your nose or some drainage.  This is from the oxygen used during your procedure.  There is no need for concern and it should clear up in a day or so.  SYMPTOMS TO REPORT IMMEDIATELY:  Following lower endoscopy (colonoscopy or flexible sigmoidoscopy):  Excessive amounts of blood in the stool  Significant tenderness or worsening of abdominal pains  Swelling of the abdomen that is new, acute  Fever of 100F or higher   For urgent or emergent issues, a gastroenterologist can be reached at any hour by calling [phone]. Do not use MyChart messaging for urgent concerns.    DIET:  We do recommend a small meal at first, but then you may proceed to your regular diet.  Drink plenty of fluids but you  should avoid alcoholic beverages for 24 hours.  ACTIVITY:  You should plan to take it easy for the rest of today and you should NOT DRIVE or use heavy machinery until tomorrow (because of the sedation medicines used during the test).    FOLLOW UP: Our staff will call the number listed on your records the next business day following your procedure.  We will call around 7:15- 8:00 am to check on you and address any questions or concerns that you may have regarding the information given to you following your procedure. If we do not reach you, we will leave a message.     If any biopsies were taken you will be contacted by phone or by letter within the next 1-3 weeks.  Please call us at [phone] if you have not heard about the biopsies in 3 weeks.    SIGNATURES/CONFIDENTIALITY: You and/or your care partner have signed paperwork which will be entered into your electronic medical record.  These signatures attest to the fact that that the information above on your After Visit Summary has been reviewed and is understood.  Full responsibility of the confidentiality of this discharge information lies with you and/or your care-partner.

## 2024-01-01 NOTE — Progress Notes (Unsigned)
Called to room to assist during endoscopic procedure.  Patient ID and intended procedure confirmed with present staff. Received instructions for my participation in the procedure from the performing physician.

## 2024-01-02 ENCOUNTER — Telehealth: Payer: Self-pay

## 2024-01-02 NOTE — Telephone Encounter (Signed)
  Follow up Call-     01/01/2024   10:12 AM  Call back number  Post procedure Call Back phone  # [phone]  Permission to leave phone message Yes     Patient questions:  Do you have a fever, pain , or abdominal swelling? No. Pain Score  0 *  Have you tolerated food without any problems? Yes.    Have you been able to return to your normal activities? Yes.    Do you have any questions about your discharge instructions: Diet   No. Medications  No. Follow up visit  No.  Do you have questions or concerns about your Care? No.  Actions: * If pain score is 4 or above: No action needed, pain <4.

## 2024-01-06 LAB — SURGICAL PATHOLOGY

## 2024-01-09 ENCOUNTER — Encounter: Payer: Self-pay | Admitting: Gastroenterology

## 2024-01-09 ENCOUNTER — Telehealth: Payer: Self-pay | Admitting: Gastroenterology

## 2024-01-09 NOTE — Telephone Encounter (Signed)
 Inbound call from patient, would like a nurse to go over procedure results with him.

## 2024-01-09 NOTE — Telephone Encounter (Signed)
 Spoke with patient regarding path letter sent to him by Dr. Chales Abrahams & advised he would be getting a copy in the mail as well. He had no further questions.
